# Patient Record
Sex: Female | Born: 1950 | Race: White | Hispanic: No | Marital: Married | Smoking: Never smoker
Health system: Southern US, Community
[De-identification: ages and names within clinical notes are randomized; demographics above are authoritative.]

## PROBLEM LIST (undated history)

## (undated) DIAGNOSIS — M1990 Unspecified osteoarthritis, unspecified site: Secondary | ICD-10-CM

## (undated) HISTORY — PX: ABDOMINAL HYSTERECTOMY: SHX81

## (undated) HISTORY — PX: AUGMENTATION MAMMAPLASTY: SUR837

## (undated) HISTORY — PX: JOINT REPLACEMENT: SHX530

## (undated) HISTORY — PX: EYE SURGERY: SHX253

---

## 1997-09-30 ENCOUNTER — Other Ambulatory Visit: Admission: RE | Admit: 1997-09-30 | Discharge: 1997-09-30 | Payer: Self-pay | Admitting: Obstetrics & Gynecology

## 1997-12-09 ENCOUNTER — Inpatient Hospital Stay (HOSPITAL_COMMUNITY): Admission: RE | Admit: 1997-12-09 | Discharge: 1997-12-12 | Payer: Self-pay | Admitting: Obstetrics & Gynecology

## 1999-01-26 ENCOUNTER — Other Ambulatory Visit: Admission: RE | Admit: 1999-01-26 | Discharge: 1999-01-26 | Payer: Self-pay | Admitting: Obstetrics & Gynecology

## 2000-02-16 ENCOUNTER — Other Ambulatory Visit: Admission: RE | Admit: 2000-02-16 | Discharge: 2000-02-16 | Payer: Self-pay | Admitting: Obstetrics & Gynecology

## 2001-04-23 ENCOUNTER — Other Ambulatory Visit: Admission: RE | Admit: 2001-04-23 | Discharge: 2001-04-23 | Payer: Self-pay | Admitting: Obstetrics & Gynecology

## 2002-06-03 ENCOUNTER — Other Ambulatory Visit: Admission: RE | Admit: 2002-06-03 | Discharge: 2002-06-03 | Payer: Self-pay | Admitting: Obstetrics & Gynecology

## 2003-06-10 ENCOUNTER — Other Ambulatory Visit: Admission: RE | Admit: 2003-06-10 | Discharge: 2003-06-10 | Payer: Self-pay | Admitting: Obstetrics & Gynecology

## 2003-08-20 ENCOUNTER — Observation Stay (HOSPITAL_COMMUNITY): Admission: RE | Admit: 2003-08-20 | Discharge: 2003-08-21 | Payer: Self-pay | Admitting: Obstetrics & Gynecology

## 2003-12-11 ENCOUNTER — Ambulatory Visit (HOSPITAL_BASED_OUTPATIENT_CLINIC_OR_DEPARTMENT_OTHER): Admission: RE | Admit: 2003-12-11 | Discharge: 2003-12-11 | Payer: Self-pay | Admitting: Plastic Surgery

## 2003-12-11 ENCOUNTER — Ambulatory Visit (HOSPITAL_COMMUNITY): Admission: RE | Admit: 2003-12-11 | Discharge: 2003-12-11 | Payer: Self-pay | Admitting: Plastic Surgery

## 2003-12-11 ENCOUNTER — Encounter (INDEPENDENT_AMBULATORY_CARE_PROVIDER_SITE_OTHER): Payer: Self-pay | Admitting: Specialist

## 2004-06-30 ENCOUNTER — Other Ambulatory Visit: Admission: RE | Admit: 2004-06-30 | Discharge: 2004-06-30 | Payer: Self-pay | Admitting: Obstetrics & Gynecology

## 2004-07-07 ENCOUNTER — Encounter: Admission: RE | Admit: 2004-07-07 | Discharge: 2004-07-07 | Payer: Self-pay | Admitting: Obstetrics & Gynecology

## 2005-07-08 ENCOUNTER — Encounter: Admission: RE | Admit: 2005-07-08 | Discharge: 2005-07-08 | Payer: Self-pay | Admitting: Obstetrics & Gynecology

## 2005-11-08 ENCOUNTER — Encounter: Admission: RE | Admit: 2005-11-08 | Discharge: 2005-11-08 | Payer: Self-pay | Admitting: Obstetrics & Gynecology

## 2006-05-10 ENCOUNTER — Encounter: Admission: RE | Admit: 2006-05-10 | Discharge: 2006-05-10 | Payer: Self-pay | Admitting: Obstetrics & Gynecology

## 2006-11-14 ENCOUNTER — Encounter: Admission: RE | Admit: 2006-11-14 | Discharge: 2006-11-14 | Payer: Self-pay | Admitting: Obstetrics & Gynecology

## 2007-05-16 ENCOUNTER — Encounter: Admission: RE | Admit: 2007-05-16 | Discharge: 2007-05-16 | Payer: Self-pay | Admitting: Obstetrics & Gynecology

## 2008-05-16 ENCOUNTER — Encounter: Admission: RE | Admit: 2008-05-16 | Discharge: 2008-05-16 | Payer: Self-pay | Admitting: Obstetrics & Gynecology

## 2009-05-18 ENCOUNTER — Encounter: Admission: RE | Admit: 2009-05-18 | Discharge: 2009-05-18 | Payer: Self-pay | Admitting: Obstetrics & Gynecology

## 2009-05-21 ENCOUNTER — Encounter: Admission: RE | Admit: 2009-05-21 | Discharge: 2009-05-21 | Payer: Self-pay | Admitting: Obstetrics & Gynecology

## 2010-04-20 ENCOUNTER — Other Ambulatory Visit: Payer: Self-pay | Admitting: Obstetrics & Gynecology

## 2010-04-20 DIAGNOSIS — Z1231 Encounter for screening mammogram for malignant neoplasm of breast: Secondary | ICD-10-CM

## 2010-05-20 ENCOUNTER — Ambulatory Visit
Admission: RE | Admit: 2010-05-20 | Discharge: 2010-05-20 | Disposition: A | Discharge status: Home / Self Care | Payer: 59 | Source: Ambulatory Visit | Attending: Obstetrics & Gynecology | Admitting: Obstetrics & Gynecology

## 2010-05-20 DIAGNOSIS — Z1231 Encounter for screening mammogram for malignant neoplasm of breast: Secondary | ICD-10-CM

## 2010-05-28 NOTE — Op Note (Signed)
NAMECHRISTON, [name]                 ACCOUNT NO.:  [id number]   MEDICAL RECORD NO.:  [id number]          PATIENT TYPE:  AMB   LOCATION:  DSC                          FACILITY:  [facility]   PHYSICIAN:  Alfredia Ferguson, M.D.  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   OPERATION:  Elliptical excision of venous hemangioma right posterior thigh.   SURGEON:  Alfredia Ferguson, M.D.   ANESTHESIA:  Xylocaine 2% with 1:100,000 epinephrine.   INDICATIONS FOR PROCEDURE:  This is a 60 year old woman with a slowly  enlarging vascular lesion of right posterior thigh.  It now interferes with  her ability to shave.  It has been recommended this area be removed to the  risk of cutting it and having it bleed.  She understands she will be trading  what she has for permanent potentially unsightly scar.  Despite that, she  wishes to proceed with the surgery.   DESCRIPTION OF PROCEDURE:  An elliptical skin mark was made around the  lesion and local anesthesia was infiltrated.  The area was prepped with  Betadine and draped with sterile drapes.  Elliptical excision of the lesion  down to the level of the subcutaneous tissue was carried out.  Specimen was  removed and passed off for pathology review.  Wound edges were closed by  approximating the dermis with interrupted 5-0 Monocryl suture.  Skin was  united with interrupted 5-0 nylon suture. The area was cleansed and dried  and light dressing was applied.      [name]  D:  12/11/2003  T:  12/11/2003  Job:  [id number]

## 2010-05-28 NOTE — Op Note (Signed)
NAME:  Diana [name], Diana [name]                           ACCOUNT NO.:  [id number]   MEDICAL RECORD NO.:  [id number]                   PATIENT TYPE:  AMB   LOCATION:  DAY                                  FACILITY:  [facility]   PHYSICIAN:  Ronald L. Ovidio Hanger, M.D.           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   OPERATIVE PROCEDURE:  1. Tension-free vaginal tape.  2. Cystoscopy.  3. Placement of suprapubic tube.   SURGEON:  Lucrezia Starch. Earlene Plater, M.D.   ASSISTANT:  Freddy Finner, M.D.   ESTIMATED BLOOD LOSS:  100 mL.   TUBES:  14 French Microvasive fader-tip suprapubic tube.   INDICATION FOR PROCEDURE:  Diana [name] is a lovely 60 year old white female who  presented with incontinence along with loss of vaginal support anterior and  posteriorly.  She has elected to undergo an anterior and posterior repair  and on urodynamics was found to have a low leak point pressure.  She was  felt to have both type 2 and type 3stress urinary incontinence.  After  understanding the risks, benefits, and alternatives, elected to proceed with  the above procedure.   PROCEDURE IN DETAIL:  After Dr. Jennette Kettle had plicated the anterior repair, I was  asked into the operating room.  A 16 French Foley catheter was placed and  the bladder was drained.  Lateral vaginal flaps were created at the  urethrovesical junction, but the endopelvic fascia was not perforated.  Punch holes were placed suprapubically one fingerbreadth superiorly and two  fingerbreadths laterally to the pubic symphysis, and utilizing the needle  for the TVTs, both were placed.  Cystourethroscopy was then performed with a  22.5 [place] Wolf panendoscope.  There were noted to be tangential needle  holes in the bladder laterally on both.  It was noted that she had a large-  volume bladder with dog-ears.  These  were removed and replaced.  The left  appeared to be totally normal with no perforations.  On the right it was not  perforated but it was just submucosal, so it was moved slightly more  lateral.  Reinspection revealed that there were no needles exposed in the  bladder at that point when the bladder was fully distended with a 70 degree  lens.  Some clots were irrigated from the bladder and the TVT tape was  placed with the needles and positioned.  Re-cystourethroscopy revealed that  there was no tape exposed and the tiny punch holes were not actively  bleeding.  The tape was then delivered in position.  Proper tension was  created.  The tape was completely deployed at the urethrovesical junction.  The bladder was then filled, and it was continent but it did not change the  angle of  the urethrovesical junction.  The tapes were cut at the skin level.  Good hemostasis was noted to be present.  The bladder was distended and a 14  [place] Microvasive fader-tip suprapubic tube was placed.  It was  locked in position utilizing the Cope mechanism and on inspection the  suprapubic tube appeared to be in good position and drained well.  The  bladder was then drained.  The case was turned back over to Dr. Jennette Kettle, who  closed the anterior vaginal defect and was proceeding with the posterior  repair.                                               Ronald L. Ovidio Hanger, M.D.    RLD/MEDQ  D:  08/20/2003  T:  08/20/2003  Job:  [id number]   cc:   Freddy Finner, M.D.  [address] [place]  [place] [address]  Fax: [phone]

## 2010-05-28 NOTE — H&P (Signed)
NAME:  Diana [name], Diana [name]                           ACCOUNT NO.:  [id number]   MEDICAL RECORD NO.:  [id number]                   PATIENT TYPE:  AMB   LOCATION:  DAY                                  FACILITY:  [facility]   PHYSICIAN:  Freddy Finner, M.D.                DATE OF BIRTH:  December 16, 1950   DATE OF PROCEDURE:  DATE OF DISCHARGE:                      STAT - MUST CHANGE TO CORRECT WORK TYPE   ADMISSION DIAGNOSIS:  Symptomatic cystocele, rectocele, stress urinary  incontinence.   HISTORY:  The patient is a 60 year old white, married female gravida 2, para  2, who had total abdominal hysterectomy, bilateral salpingo-oophorectomy in  1999 for very large fibroids and abnormal bleeding.  She also had loss of  urethral vesicle angle, had bilateral simple ovarian cysts and an  endometrial polyp.  At that time in addition to the abdominal hysterectomy  and bilateral salpingo-oophorectomy was accomplished, a Burch suspension of  the bladder.  The patient did well postoperatively, was maintained on a  Vivelle-Dot skin patch for her estrogen replacement therapy.  Approximately  2 years ago she had the onset of symptoms and is complaining of stress  urinary incontinence.  She does have a small cystocele and small rectocele.  She has requested surgical intervention.  She has been evaluated by Dr. Darvin Neighbours in the plan is to do a combined procedure with an anterior and  posterior vaginal repair, and Dr. Earlene Plater will do a sling suspension of the  urethra.   REVIEW OF SYSTEMS:  Her recurrent review of systems is otherwise negative.   PAST MEDICAL HISTORY:  The patient is noted to be ALLERGIC TO PENICILLIN.  She has no known significant medical illnesses.  She has intermittently had  some mild depression and has been treated with serotonin reuptake  inhibitors.  She is currently on no medications except Vivelle-Dot.   PREVIOUS SURGICAL PROCEDURES:  Include vaginal birth x2.  Hysterectomy noted  above.   She has had bunion surgery.  She had breast implants in 1984.  She  has had laser eye surgery.   FAMILY HISTORY:  Noncontributory.   Her most recent Pap smear was June of 2005 and was normal.  Most recent  mammogram was June of 2005 and it was normal.   PHYSICAL EXAMINATION:  HEENT:  Grossly within normal limits.  VITAL SIGNS:  Blood pressure in the office 118/76.  NECK:  Thyroid gland is not palpably enlarged.  CHEST:  Clear to auscultation.  HEART:  Normal sinus rhythm without murmurs, rubs, or gallops.  ABDOMEN:  Soft and nontender without appreciable organomegaly or palpable  masses.  BREAST EXAM:  Is considered to be normal.  PELVIC FINDINGS:  Are as noted in present illness.  EXTREMITIES:  Without cyanosis, clubbing or edema.   IMPRESSION:  Symptomatic cystocele, rectocele.   PLAN:  Surgery as noted above.  Freddy Finner, M.D.    WRN/MEDQ  D:  08/19/2003  T:  08/19/2003  Job:  [id number]

## 2010-05-28 NOTE — Op Note (Signed)
NAME:  DEONDREA, AGUADO                           ACCOUNT NO.:  [id number]   MEDICAL RECORD NO.:  [id number]                   PATIENT TYPE:  OBV   LOCATION:  0444                                 FACILITY:  [facility]   PHYSICIAN:  Freddy Finner, M.D.                DATE OF BIRTH:  [birth year]   DATE OF PROCEDURE:  08/21/2003  DATE OF DISCHARGE:                                 OPERATIVE REPORT   PREOPERATIVE DIAGNOSES:  1. Cystocele.  2. Rectocele.  3. Stress urinary incontinence.   POSTOPERATIVE DIAGNOSES:  1. Cystocele.  2. Rectocele.  3. Stress urinary incontinence.   OPERATIVE PROCEDURES:  1. Anterior and posterior vaginal repair.  2. Tension-free tape procedure per Windy Fast L. Earlene Plater, M.D.  That portion of     the procedure will be dictated by him.   ANESTHESIA:  General.   ESTIMATED INTRAOPERATIVE BLOOD LOSS:  300 mL.   INTRAOPERATIVE COMPLICATIONS:  None for my portion of the procedure.   CONDITION ON DISCHARGE TO THE RECOVERY ROOM:  Good.   Details of the present were recorded in the admission note.  The patient was  admitted on the morning of surgery.  She was placed in PAS hose.  She was  given a gram of Rocephin IV.  She was brought to the operating room and  there placed under adequate general anesthesia.  She was placed in the  dorsal lithotomy position using the [employer] system.  Betadine prep was  carried out in the usual fashion, including lower abdomen, perineum, vagina.  Sterile drapes were applied.  A posterior weighted vaginal retractor was  placed.  Anterior mucosa on the anterior vaginal wall was grasped with  Allises in the midline, and an incision was made with a scalpel.  Edges of  the mucosa were grasped with Allises.  The dissection was continued with  progressive sharp and blunt dissection and extension of the incision to a  level of approximately 1 cm proximal to the urethral meatus.  The  vesicovaginal fascia was carefully dissected and freed from  the mucosa,  freeing the bladder and the cystocele.  The cystocele was then reduced with  a total of three sutures using 0 Monocryl.  Dr. Earlene Plater then performed his  portion of the procedure.  The anterior incision was then closed in a  running locking 2-0 Monocryl suture.  Attention was turned posteriorly.  Edges of the mucosa at the level of the fourchette were grasped on either  side with Allises.  A pyramidal-shaped segment of tissue was excised from  the perineal body, which was skin.  The mucosa overlying the rectum was then  elevated with Allises and progressive blunt and sharp dissection was carried  out and incision made for a distance of approximately 6-8 cm along the  posterior wall.  The perirectal tissue was dissected free of the vaginal  mucosa.  Perirectal tissue  was then approximated from side to side using 0  Monocryl pop-off sutures.  The levators were approximated and elevated.  The  mucosa was then closed in a fashion similar to an episiotomy with running  locking suture along the posterior vaginal wall and subcuticular sutures  along the perineal body.  Hemostasis at this point  was felt to be adequate.  Two-inch iodoform gauze was placed in the vagina  as a pack.  The incisions made by Dr. Earlene Plater were closed with skin glue.  The  suprapubic catheter also placed by him was anchored to the skin with 2-0  nylon.  The patient was awakened and taken to the recovery room in good  condition.                                               Freddy Finner, M.D.    WRN/MEDQ  D:  08/20/2003  T:  08/21/2003  Job:  [id number]

## 2011-04-27 ENCOUNTER — Other Ambulatory Visit: Payer: Self-pay | Admitting: Obstetrics & Gynecology

## 2011-04-27 DIAGNOSIS — Z1231 Encounter for screening mammogram for malignant neoplasm of breast: Secondary | ICD-10-CM

## 2011-05-31 ENCOUNTER — Other Ambulatory Visit: Payer: Self-pay | Admitting: Dermatology

## 2011-05-31 ENCOUNTER — Ambulatory Visit
Admission: RE | Admit: 2011-05-31 | Discharge: 2011-05-31 | Disposition: A | Discharge status: Home / Self Care | Payer: 59 | Source: Ambulatory Visit | Attending: Obstetrics & Gynecology | Admitting: Obstetrics & Gynecology

## 2011-05-31 DIAGNOSIS — Z1231 Encounter for screening mammogram for malignant neoplasm of breast: Secondary | ICD-10-CM

## 2012-06-21 ENCOUNTER — Other Ambulatory Visit: Payer: Self-pay

## 2012-06-21 DIAGNOSIS — Z1231 Encounter for screening mammogram for malignant neoplasm of breast: Secondary | ICD-10-CM

## 2012-08-16 ENCOUNTER — Ambulatory Visit
Admission: RE | Admit: 2012-08-16 | Discharge: 2012-08-16 | Disposition: A | Discharge status: Home / Self Care | Payer: 59 | Source: Ambulatory Visit

## 2012-08-16 DIAGNOSIS — Z1231 Encounter for screening mammogram for malignant neoplasm of breast: Secondary | ICD-10-CM

## 2013-08-05 ENCOUNTER — Other Ambulatory Visit: Payer: Self-pay

## 2013-08-05 DIAGNOSIS — Z1231 Encounter for screening mammogram for malignant neoplasm of breast: Secondary | ICD-10-CM

## 2013-09-02 ENCOUNTER — Ambulatory Visit
Admission: RE | Admit: 2013-09-02 | Discharge: 2013-09-02 | Disposition: A | Discharge status: Home / Self Care | Payer: 59 | Source: Ambulatory Visit

## 2013-09-02 ENCOUNTER — Encounter (INDEPENDENT_AMBULATORY_CARE_PROVIDER_SITE_OTHER): Payer: Self-pay

## 2013-09-02 DIAGNOSIS — Z1231 Encounter for screening mammogram for malignant neoplasm of breast: Secondary | ICD-10-CM

## 2013-11-04 ENCOUNTER — Other Ambulatory Visit: Payer: Self-pay | Admitting: Obstetrics & Gynecology

## 2013-11-06 LAB — CYTOLOGY - PAP

## 2014-08-18 ENCOUNTER — Other Ambulatory Visit: Payer: Self-pay

## 2014-08-18 DIAGNOSIS — Z1231 Encounter for screening mammogram for malignant neoplasm of breast: Secondary | ICD-10-CM

## 2014-10-21 ENCOUNTER — Ambulatory Visit
Admission: RE | Admit: 2014-10-21 | Discharge: 2014-10-21 | Disposition: A | Discharge status: Home / Self Care | Payer: 59 | Source: Ambulatory Visit

## 2014-10-21 DIAGNOSIS — Z1231 Encounter for screening mammogram for malignant neoplasm of breast: Secondary | ICD-10-CM

## 2014-10-22 ENCOUNTER — Other Ambulatory Visit: Payer: Self-pay | Admitting: Obstetrics & Gynecology

## 2014-10-22 DIAGNOSIS — R928 Other abnormal and inconclusive findings on diagnostic imaging of breast: Secondary | ICD-10-CM

## 2014-11-18 ENCOUNTER — Ambulatory Visit
Admission: RE | Admit: 2014-11-18 | Discharge: 2014-11-18 | Disposition: A | Discharge status: Home / Self Care | Payer: 59 | Source: Ambulatory Visit | Attending: Obstetrics & Gynecology | Admitting: Obstetrics & Gynecology

## 2014-11-18 DIAGNOSIS — R928 Other abnormal and inconclusive findings on diagnostic imaging of breast: Secondary | ICD-10-CM

## 2015-09-25 ENCOUNTER — Other Ambulatory Visit: Payer: Self-pay | Admitting: Obstetrics & Gynecology

## 2015-09-25 DIAGNOSIS — Z9882 Breast implant status: Secondary | ICD-10-CM

## 2015-09-25 DIAGNOSIS — Z1231 Encounter for screening mammogram for malignant neoplasm of breast: Secondary | ICD-10-CM

## 2015-10-28 ENCOUNTER — Ambulatory Visit
Admission: RE | Admit: 2015-10-28 | Discharge: 2015-10-28 | Disposition: A | Discharge status: Home / Self Care | Payer: 59 | Source: Ambulatory Visit | Attending: Obstetrics & Gynecology | Admitting: Obstetrics & Gynecology

## 2015-10-28 DIAGNOSIS — Z9882 Breast implant status: Secondary | ICD-10-CM

## 2015-10-28 DIAGNOSIS — Z1231 Encounter for screening mammogram for malignant neoplasm of breast: Secondary | ICD-10-CM

## 2016-10-17 ENCOUNTER — Other Ambulatory Visit: Payer: Self-pay | Admitting: Obstetrics & Gynecology

## 2016-10-17 DIAGNOSIS — Z1231 Encounter for screening mammogram for malignant neoplasm of breast: Secondary | ICD-10-CM

## 2016-11-28 ENCOUNTER — Ambulatory Visit
Admission: RE | Admit: 2016-11-28 | Discharge: 2016-11-28 | Disposition: A | Discharge status: Home / Self Care | Payer: Medicare HMO | Source: Ambulatory Visit | Attending: Obstetrics & Gynecology | Admitting: Obstetrics & Gynecology

## 2016-11-28 DIAGNOSIS — Z1231 Encounter for screening mammogram for malignant neoplasm of breast: Secondary | ICD-10-CM

## 2017-01-16 DIAGNOSIS — Z90710 Acquired absence of both cervix and uterus: Secondary | ICD-10-CM | POA: Diagnosis not present

## 2017-01-16 DIAGNOSIS — Z01419 Encounter for gynecological examination (general) (routine) without abnormal findings: Secondary | ICD-10-CM | POA: Diagnosis not present

## 2017-01-16 DIAGNOSIS — Z1272 Encounter for screening for malignant neoplasm of vagina: Secondary | ICD-10-CM | POA: Diagnosis not present

## 2017-01-16 DIAGNOSIS — Z6825 Body mass index (BMI) 25.0-25.9, adult: Secondary | ICD-10-CM | POA: Diagnosis not present

## 2017-03-06 DIAGNOSIS — R82998 Other abnormal findings in urine: Secondary | ICD-10-CM | POA: Diagnosis not present

## 2017-03-06 DIAGNOSIS — Z79899 Other long term (current) drug therapy: Secondary | ICD-10-CM | POA: Diagnosis not present

## 2017-03-06 DIAGNOSIS — M859 Disorder of bone density and structure, unspecified: Secondary | ICD-10-CM | POA: Diagnosis not present

## 2017-03-09 DIAGNOSIS — Z1212 Encounter for screening for malignant neoplasm of rectum: Secondary | ICD-10-CM | POA: Diagnosis not present

## 2017-03-13 DIAGNOSIS — Z23 Encounter for immunization: Secondary | ICD-10-CM | POA: Diagnosis not present

## 2017-03-13 DIAGNOSIS — Z1389 Encounter for screening for other disorder: Secondary | ICD-10-CM | POA: Diagnosis not present

## 2017-03-13 DIAGNOSIS — Z6824 Body mass index (BMI) 24.0-24.9, adult: Secondary | ICD-10-CM | POA: Diagnosis not present

## 2017-03-13 DIAGNOSIS — M859 Disorder of bone density and structure, unspecified: Secondary | ICD-10-CM | POA: Diagnosis not present

## 2017-03-13 DIAGNOSIS — Z0000 Encounter for general adult medical examination without abnormal findings: Secondary | ICD-10-CM | POA: Diagnosis not present

## 2017-03-25 DIAGNOSIS — J22 Unspecified acute lower respiratory infection: Secondary | ICD-10-CM | POA: Diagnosis not present

## 2017-04-25 DIAGNOSIS — D1801 Hemangioma of skin and subcutaneous tissue: Secondary | ICD-10-CM | POA: Diagnosis not present

## 2017-04-25 DIAGNOSIS — D2261 Melanocytic nevi of right upper limb, including shoulder: Secondary | ICD-10-CM | POA: Diagnosis not present

## 2017-04-25 DIAGNOSIS — D2262 Melanocytic nevi of left upper limb, including shoulder: Secondary | ICD-10-CM | POA: Diagnosis not present

## 2017-04-25 DIAGNOSIS — L812 Freckles: Secondary | ICD-10-CM | POA: Diagnosis not present

## 2017-04-25 DIAGNOSIS — Z85828 Personal history of other malignant neoplasm of skin: Secondary | ICD-10-CM | POA: Diagnosis not present

## 2017-04-25 DIAGNOSIS — D2271 Melanocytic nevi of right lower limb, including hip: Secondary | ICD-10-CM | POA: Diagnosis not present

## 2017-04-25 DIAGNOSIS — D225 Melanocytic nevi of trunk: Secondary | ICD-10-CM | POA: Diagnosis not present

## 2017-04-25 DIAGNOSIS — L920 Granuloma annulare: Secondary | ICD-10-CM | POA: Diagnosis not present

## 2017-04-25 DIAGNOSIS — L821 Other seborrheic keratosis: Secondary | ICD-10-CM | POA: Diagnosis not present

## 2017-04-25 DIAGNOSIS — D2272 Melanocytic nevi of left lower limb, including hip: Secondary | ICD-10-CM | POA: Diagnosis not present

## 2017-06-19 DIAGNOSIS — H5203 Hypermetropia, bilateral: Secondary | ICD-10-CM | POA: Diagnosis not present

## 2017-06-19 DIAGNOSIS — H2513 Age-related nuclear cataract, bilateral: Secondary | ICD-10-CM | POA: Diagnosis not present

## 2017-06-19 DIAGNOSIS — H04123 Dry eye syndrome of bilateral lacrimal glands: Secondary | ICD-10-CM | POA: Diagnosis not present

## 2017-10-27 ENCOUNTER — Other Ambulatory Visit: Payer: Self-pay | Admitting: Obstetrics & Gynecology

## 2017-10-27 DIAGNOSIS — Z1231 Encounter for screening mammogram for malignant neoplasm of breast: Secondary | ICD-10-CM

## 2017-12-05 ENCOUNTER — Ambulatory Visit
Admission: RE | Admit: 2017-12-05 | Discharge: 2017-12-05 | Disposition: A | Discharge status: Home / Self Care | Payer: Medicare HMO | Source: Ambulatory Visit | Attending: Obstetrics & Gynecology | Admitting: Obstetrics & Gynecology

## 2017-12-05 DIAGNOSIS — Z1231 Encounter for screening mammogram for malignant neoplasm of breast: Secondary | ICD-10-CM

## 2018-02-22 ENCOUNTER — Other Ambulatory Visit: Payer: Self-pay | Admitting: Obstetrics & Gynecology

## 2018-02-22 DIAGNOSIS — N6310 Unspecified lump in the right breast, unspecified quadrant: Secondary | ICD-10-CM

## 2018-02-22 DIAGNOSIS — Z6825 Body mass index (BMI) 25.0-25.9, adult: Secondary | ICD-10-CM | POA: Diagnosis not present

## 2018-02-22 DIAGNOSIS — N958 Other specified menopausal and perimenopausal disorders: Secondary | ICD-10-CM | POA: Diagnosis not present

## 2018-02-22 DIAGNOSIS — M8588 Other specified disorders of bone density and structure, other site: Secondary | ICD-10-CM | POA: Diagnosis not present

## 2018-02-22 DIAGNOSIS — Z01419 Encounter for gynecological examination (general) (routine) without abnormal findings: Secondary | ICD-10-CM | POA: Diagnosis not present

## 2018-03-01 ENCOUNTER — Ambulatory Visit
Admission: RE | Admit: 2018-03-01 | Discharge: 2018-03-01 | Disposition: A | Discharge status: Home / Self Care | Payer: Medicare HMO | Source: Ambulatory Visit | Attending: Obstetrics & Gynecology | Admitting: Obstetrics & Gynecology

## 2018-03-01 DIAGNOSIS — N6310 Unspecified lump in the right breast, unspecified quadrant: Secondary | ICD-10-CM

## 2018-03-01 DIAGNOSIS — N6311 Unspecified lump in the right breast, upper outer quadrant: Secondary | ICD-10-CM | POA: Diagnosis not present

## 2018-03-01 DIAGNOSIS — R922 Inconclusive mammogram: Secondary | ICD-10-CM | POA: Diagnosis not present

## 2018-03-08 DIAGNOSIS — M859 Disorder of bone density and structure, unspecified: Secondary | ICD-10-CM | POA: Diagnosis not present

## 2018-03-08 DIAGNOSIS — Z79899 Other long term (current) drug therapy: Secondary | ICD-10-CM | POA: Diagnosis not present

## 2018-03-08 DIAGNOSIS — R82998 Other abnormal findings in urine: Secondary | ICD-10-CM | POA: Diagnosis not present

## 2018-03-16 DIAGNOSIS — E7849 Other hyperlipidemia: Secondary | ICD-10-CM | POA: Diagnosis not present

## 2018-03-16 DIAGNOSIS — Z6825 Body mass index (BMI) 25.0-25.9, adult: Secondary | ICD-10-CM | POA: Diagnosis not present

## 2018-03-16 DIAGNOSIS — Z0000 Encounter for general adult medical examination without abnormal findings: Secondary | ICD-10-CM | POA: Diagnosis not present

## 2018-03-16 DIAGNOSIS — Z1389 Encounter for screening for other disorder: Secondary | ICD-10-CM | POA: Diagnosis not present

## 2018-03-16 DIAGNOSIS — M859 Disorder of bone density and structure, unspecified: Secondary | ICD-10-CM | POA: Diagnosis not present

## 2018-03-16 DIAGNOSIS — Z859 Personal history of malignant neoplasm, unspecified: Secondary | ICD-10-CM | POA: Diagnosis not present

## 2018-03-16 DIAGNOSIS — Z1331 Encounter for screening for depression: Secondary | ICD-10-CM | POA: Diagnosis not present

## 2018-10-18 DIAGNOSIS — Z23 Encounter for immunization: Secondary | ICD-10-CM | POA: Diagnosis not present

## 2019-01-29 ENCOUNTER — Ambulatory Visit: Payer: Medicare Other | Attending: Internal Medicine

## 2019-01-29 DIAGNOSIS — Z23 Encounter for immunization: Secondary | ICD-10-CM | POA: Insufficient documentation

## 2019-01-29 NOTE — Progress Notes (Signed)
   Covid-19 Vaccination Clinic  Name:  Diana [name]    MRN: [id number] DOB: 20-Sep-1950  01/29/2019  Diana [name] was observed post Covid-19 immunization for 15 minutes without incidence. She was provided with Vaccine Information Sheet and instruction to access the V-Safe system.   Diana [name] was instructed to call 911 with any severe reactions post vaccine: Marland Kitchen Difficulty breathing  . Swelling of your face and throat  . A fast heartbeat  . A bad rash all over your body  . Dizziness and weakness    Immunizations Administered    Name Date Dose VIS Date Route   Pfizer COVID-19 Vaccine 01/29/2019  3:48 PM 0.3 mL 12/21/2018 Intramuscular   Manufacturer: [place]   Lot: [id number]   [place]: [id number]

## 2019-02-20 ENCOUNTER — Ambulatory Visit: Payer: Medicare HMO | Attending: Internal Medicine

## 2019-02-20 DIAGNOSIS — Z23 Encounter for immunization: Secondary | ICD-10-CM

## 2019-02-20 NOTE — Progress Notes (Signed)
   Covid-19 Vaccination Clinic  Name:  Diana [name]    MRN: [id number] DOB: 07-05-1950  02/20/2019  Diana [name] was observed post Covid-19 immunization for 15 minutes without incidence. She was provided with Vaccine Information Sheet and instruction to access the V-Safe system.   Diana [name] was instructed to call 911 with any severe reactions post vaccine: Marland Kitchen Difficulty breathing  . Swelling of your face and throat  . A fast heartbeat  . A bad rash all over your body  . Dizziness and weakness    Immunizations Administered    Name Date Dose VIS Date Route   Pfizer COVID-19 Vaccine 02/20/2019  8:22 AM 0.3 mL 12/21/2018 Intramuscular   Manufacturer: [place]   Lot: [id number]   [place]: [id number]

## 2019-02-26 ENCOUNTER — Other Ambulatory Visit: Payer: Self-pay | Admitting: Obstetrics & Gynecology

## 2019-02-26 DIAGNOSIS — Z1231 Encounter for screening mammogram for malignant neoplasm of breast: Secondary | ICD-10-CM

## 2019-03-12 DIAGNOSIS — Z124 Encounter for screening for malignant neoplasm of cervix: Secondary | ICD-10-CM | POA: Diagnosis not present

## 2019-03-12 DIAGNOSIS — Z6824 Body mass index (BMI) 24.0-24.9, adult: Secondary | ICD-10-CM | POA: Diagnosis not present

## 2019-03-13 DIAGNOSIS — E7849 Other hyperlipidemia: Secondary | ICD-10-CM | POA: Diagnosis not present

## 2019-03-13 DIAGNOSIS — M859 Disorder of bone density and structure, unspecified: Secondary | ICD-10-CM | POA: Diagnosis not present

## 2019-03-13 DIAGNOSIS — Z0000 Encounter for general adult medical examination without abnormal findings: Secondary | ICD-10-CM | POA: Diagnosis not present

## 2019-03-20 DIAGNOSIS — E785 Hyperlipidemia, unspecified: Secondary | ICD-10-CM | POA: Diagnosis not present

## 2019-03-20 DIAGNOSIS — Z0000 Encounter for general adult medical examination without abnormal findings: Secondary | ICD-10-CM | POA: Diagnosis not present

## 2019-03-20 DIAGNOSIS — M8580 Other specified disorders of bone density and structure, unspecified site: Secondary | ICD-10-CM | POA: Diagnosis not present

## 2019-04-04 ENCOUNTER — Ambulatory Visit
Admission: RE | Admit: 2019-04-04 | Discharge: 2019-04-04 | Disposition: A | Discharge status: Home / Self Care | Payer: Medicare HMO | Source: Ambulatory Visit | Attending: Obstetrics & Gynecology | Admitting: Obstetrics & Gynecology

## 2019-04-04 ENCOUNTER — Other Ambulatory Visit: Payer: Self-pay

## 2019-04-04 DIAGNOSIS — Z1231 Encounter for screening mammogram for malignant neoplasm of breast: Secondary | ICD-10-CM | POA: Diagnosis not present

## 2019-05-13 DIAGNOSIS — L821 Other seborrheic keratosis: Secondary | ICD-10-CM | POA: Diagnosis not present

## 2019-05-13 DIAGNOSIS — D2222 Melanocytic nevi of left ear and external auricular canal: Secondary | ICD-10-CM | POA: Diagnosis not present

## 2019-05-13 DIAGNOSIS — Z85828 Personal history of other malignant neoplasm of skin: Secondary | ICD-10-CM | POA: Diagnosis not present

## 2019-05-13 DIAGNOSIS — D1801 Hemangioma of skin and subcutaneous tissue: Secondary | ICD-10-CM | POA: Diagnosis not present

## 2019-05-13 DIAGNOSIS — D2261 Melanocytic nevi of right upper limb, including shoulder: Secondary | ICD-10-CM | POA: Diagnosis not present

## 2019-05-13 DIAGNOSIS — C44519 Basal cell carcinoma of skin of other part of trunk: Secondary | ICD-10-CM | POA: Diagnosis not present

## 2019-05-13 DIAGNOSIS — D485 Neoplasm of uncertain behavior of skin: Secondary | ICD-10-CM | POA: Diagnosis not present

## 2019-07-30 DIAGNOSIS — H2513 Age-related nuclear cataract, bilateral: Secondary | ICD-10-CM | POA: Diagnosis not present

## 2019-07-30 DIAGNOSIS — H524 Presbyopia: Secondary | ICD-10-CM | POA: Diagnosis not present

## 2019-10-26 DIAGNOSIS — Z23 Encounter for immunization: Secondary | ICD-10-CM | POA: Diagnosis not present

## 2019-12-19 ENCOUNTER — Other Ambulatory Visit: Payer: Self-pay

## 2019-12-19 ENCOUNTER — Ambulatory Visit (INDEPENDENT_AMBULATORY_CARE_PROVIDER_SITE_OTHER): Payer: Medicare HMO

## 2019-12-19 ENCOUNTER — Ambulatory Visit: Payer: Medicare HMO | Admitting: Podiatry

## 2019-12-19 ENCOUNTER — Other Ambulatory Visit: Payer: Self-pay | Admitting: Podiatry

## 2019-12-19 DIAGNOSIS — M7672 Peroneal tendinitis, left leg: Secondary | ICD-10-CM

## 2019-12-19 DIAGNOSIS — M7671 Peroneal tendinitis, right leg: Secondary | ICD-10-CM

## 2019-12-19 DIAGNOSIS — M79671 Pain in right foot: Secondary | ICD-10-CM

## 2019-12-19 DIAGNOSIS — M79672 Pain in left foot: Secondary | ICD-10-CM | POA: Diagnosis not present

## 2019-12-19 MED ORDER — TRIAMCINOLONE ACETONIDE 10 MG/ML IJ SUSP
10.0000 mg | Freq: Once | INTRAMUSCULAR | Status: AC
  Administered 2019-12-19: 10 mg

## 2019-12-19 NOTE — Progress Notes (Signed)
Subjective:   Patient ID: Diana [name], female   DOB: 69 y.o.   MRN: [id number]   HPI Patient presents stating she has been getting a lot of pain in her left ankle and into the outside of the right foot and ankle.  States she does not remember specific injury but states it is been sore and swelling and it is been hard for her to be active but she did walk on the beach and it seemed to occur after an especially long walk on the beach.  Patient does not smoke likes to be active   Review of Systems  All other systems reviewed and are negative.       Objective:  Physical Exam Vitals and nursing note reviewed.  Constitutional:      Appearance: She is well-developed and well-nourished.  Cardiovascular:     Pulses: Intact distal pulses.  Pulmonary:     Effort: Pulmonary effort is normal.  Musculoskeletal:        General: Normal range of motion.  Skin:    General: Skin is warm.  Neurological:     Mental Status: She is alert.     Neurovascular status intact muscle strength adequate range of motion within normal limits.  Patient is found to have inflammation pain within the peroneal complex right with moderate swelling associated with it and it is sore with palpation.  No signs of muscle strength loss or muscle compromise from this condition.  Patient has good digital perfusion well oriented x3     Assessment:  Inflammatory peroneal tendinitis right secondary to probable gait change from exercise     Plan:  H&P x-ray done and I went ahead did sterile prep and injected the peroneal complex near insertion 3 mg Kenalog 5 g Xylocaine applied ankle compression stocking advised on ice therapy topical antiinflammatory treatment and patient will be seen back as symptoms indicate  X-rays were negative for signs of fracture did indicate moderate bunion no other pathology noted

## 2020-03-18 ENCOUNTER — Other Ambulatory Visit: Payer: Self-pay | Admitting: Obstetrics & Gynecology

## 2020-03-18 DIAGNOSIS — Z1231 Encounter for screening mammogram for malignant neoplasm of breast: Secondary | ICD-10-CM

## 2020-03-19 DIAGNOSIS — Z01419 Encounter for gynecological examination (general) (routine) without abnormal findings: Secondary | ICD-10-CM | POA: Diagnosis not present

## 2020-03-19 DIAGNOSIS — Z6825 Body mass index (BMI) 25.0-25.9, adult: Secondary | ICD-10-CM | POA: Diagnosis not present

## 2020-03-20 DIAGNOSIS — M859 Disorder of bone density and structure, unspecified: Secondary | ICD-10-CM | POA: Diagnosis not present

## 2020-03-20 DIAGNOSIS — E785 Hyperlipidemia, unspecified: Secondary | ICD-10-CM | POA: Diagnosis not present

## 2020-03-27 DIAGNOSIS — Z0000 Encounter for general adult medical examination without abnormal findings: Secondary | ICD-10-CM | POA: Diagnosis not present

## 2020-03-27 DIAGNOSIS — R82998 Other abnormal findings in urine: Secondary | ICD-10-CM | POA: Diagnosis not present

## 2020-03-27 DIAGNOSIS — E785 Hyperlipidemia, unspecified: Secondary | ICD-10-CM | POA: Diagnosis not present

## 2020-03-27 DIAGNOSIS — K921 Melena: Secondary | ICD-10-CM | POA: Diagnosis not present

## 2020-03-27 DIAGNOSIS — Z1339 Encounter for screening examination for other mental health and behavioral disorders: Secondary | ICD-10-CM | POA: Diagnosis not present

## 2020-03-27 DIAGNOSIS — Z1331 Encounter for screening for depression: Secondary | ICD-10-CM | POA: Diagnosis not present

## 2020-05-12 ENCOUNTER — Ambulatory Visit
Admission: RE | Admit: 2020-05-12 | Discharge: 2020-05-12 | Disposition: A | Discharge status: Home / Self Care | Payer: Medicare HMO | Source: Ambulatory Visit | Attending: Obstetrics & Gynecology | Admitting: Obstetrics & Gynecology

## 2020-05-12 ENCOUNTER — Other Ambulatory Visit: Payer: Self-pay

## 2020-05-12 DIAGNOSIS — Z1231 Encounter for screening mammogram for malignant neoplasm of breast: Secondary | ICD-10-CM

## 2020-05-12 DIAGNOSIS — Z85828 Personal history of other malignant neoplasm of skin: Secondary | ICD-10-CM | POA: Diagnosis not present

## 2020-05-12 DIAGNOSIS — D225 Melanocytic nevi of trunk: Secondary | ICD-10-CM | POA: Diagnosis not present

## 2020-05-12 DIAGNOSIS — D2262 Melanocytic nevi of left upper limb, including shoulder: Secondary | ICD-10-CM | POA: Diagnosis not present

## 2020-05-12 DIAGNOSIS — D2261 Melanocytic nevi of right upper limb, including shoulder: Secondary | ICD-10-CM | POA: Diagnosis not present

## 2020-05-12 DIAGNOSIS — D1801 Hemangioma of skin and subcutaneous tissue: Secondary | ICD-10-CM | POA: Diagnosis not present

## 2020-05-12 DIAGNOSIS — L821 Other seborrheic keratosis: Secondary | ICD-10-CM | POA: Diagnosis not present

## 2020-05-12 DIAGNOSIS — L812 Freckles: Secondary | ICD-10-CM | POA: Diagnosis not present

## 2020-06-23 DIAGNOSIS — R269 Unspecified abnormalities of gait and mobility: Secondary | ICD-10-CM | POA: Diagnosis not present

## 2020-06-23 DIAGNOSIS — M79671 Pain in right foot: Secondary | ICD-10-CM | POA: Diagnosis not present

## 2020-06-23 DIAGNOSIS — M79604 Pain in right leg: Secondary | ICD-10-CM | POA: Diagnosis not present

## 2020-07-16 DIAGNOSIS — M79671 Pain in right foot: Secondary | ICD-10-CM | POA: Diagnosis not present

## 2020-07-16 DIAGNOSIS — M79604 Pain in right leg: Secondary | ICD-10-CM | POA: Diagnosis not present

## 2020-07-16 DIAGNOSIS — R269 Unspecified abnormalities of gait and mobility: Secondary | ICD-10-CM | POA: Diagnosis not present

## 2020-07-22 DIAGNOSIS — M79604 Pain in right leg: Secondary | ICD-10-CM | POA: Diagnosis not present

## 2020-07-22 DIAGNOSIS — M79671 Pain in right foot: Secondary | ICD-10-CM | POA: Diagnosis not present

## 2020-07-22 DIAGNOSIS — R269 Unspecified abnormalities of gait and mobility: Secondary | ICD-10-CM | POA: Diagnosis not present

## 2020-08-06 DIAGNOSIS — M79671 Pain in right foot: Secondary | ICD-10-CM | POA: Diagnosis not present

## 2020-08-06 DIAGNOSIS — M79604 Pain in right leg: Secondary | ICD-10-CM | POA: Diagnosis not present

## 2020-08-06 DIAGNOSIS — R269 Unspecified abnormalities of gait and mobility: Secondary | ICD-10-CM | POA: Diagnosis not present

## 2020-08-07 DIAGNOSIS — H524 Presbyopia: Secondary | ICD-10-CM | POA: Diagnosis not present

## 2020-08-07 DIAGNOSIS — H2513 Age-related nuclear cataract, bilateral: Secondary | ICD-10-CM | POA: Diagnosis not present

## 2020-08-25 DIAGNOSIS — R269 Unspecified abnormalities of gait and mobility: Secondary | ICD-10-CM | POA: Diagnosis not present

## 2020-08-25 DIAGNOSIS — M79671 Pain in right foot: Secondary | ICD-10-CM | POA: Diagnosis not present

## 2020-08-25 DIAGNOSIS — M79604 Pain in right leg: Secondary | ICD-10-CM | POA: Diagnosis not present

## 2020-10-28 DIAGNOSIS — M1611 Unilateral primary osteoarthritis, right hip: Secondary | ICD-10-CM | POA: Diagnosis not present

## 2020-10-28 DIAGNOSIS — M25551 Pain in right hip: Secondary | ICD-10-CM | POA: Diagnosis not present

## 2020-11-03 DIAGNOSIS — K6389 Other specified diseases of intestine: Secondary | ICD-10-CM | POA: Diagnosis not present

## 2020-11-03 DIAGNOSIS — K635 Polyp of colon: Secondary | ICD-10-CM | POA: Diagnosis not present

## 2020-11-03 DIAGNOSIS — Z1211 Encounter for screening for malignant neoplasm of colon: Secondary | ICD-10-CM | POA: Diagnosis not present

## 2020-11-03 DIAGNOSIS — D122 Benign neoplasm of ascending colon: Secondary | ICD-10-CM | POA: Diagnosis not present

## 2020-11-07 DIAGNOSIS — Z23 Encounter for immunization: Secondary | ICD-10-CM | POA: Diagnosis not present

## 2020-11-20 DIAGNOSIS — Z79899 Other long term (current) drug therapy: Secondary | ICD-10-CM | POA: Diagnosis not present

## 2020-11-20 DIAGNOSIS — Z5181 Encounter for therapeutic drug level monitoring: Secondary | ICD-10-CM | POA: Diagnosis not present

## 2020-11-20 DIAGNOSIS — M1611 Unilateral primary osteoarthritis, right hip: Secondary | ICD-10-CM | POA: Diagnosis not present

## 2020-11-23 DIAGNOSIS — E785 Hyperlipidemia, unspecified: Secondary | ICD-10-CM | POA: Diagnosis not present

## 2020-11-23 DIAGNOSIS — Z01818 Encounter for other preprocedural examination: Secondary | ICD-10-CM | POA: Diagnosis not present

## 2020-11-23 DIAGNOSIS — R030 Elevated blood-pressure reading, without diagnosis of hypertension: Secondary | ICD-10-CM | POA: Diagnosis not present

## 2020-11-23 DIAGNOSIS — M1611 Unilateral primary osteoarthritis, right hip: Secondary | ICD-10-CM | POA: Diagnosis not present

## 2020-12-14 DIAGNOSIS — M1611 Unilateral primary osteoarthritis, right hip: Secondary | ICD-10-CM | POA: Diagnosis not present

## 2020-12-14 DIAGNOSIS — Z96641 Presence of right artificial hip joint: Secondary | ICD-10-CM | POA: Diagnosis not present

## 2021-02-03 DIAGNOSIS — M1612 Unilateral primary osteoarthritis, left hip: Secondary | ICD-10-CM | POA: Diagnosis not present

## 2021-02-03 DIAGNOSIS — Z4789 Encounter for other orthopedic aftercare: Secondary | ICD-10-CM | POA: Diagnosis not present

## 2021-04-05 DIAGNOSIS — M859 Disorder of bone density and structure, unspecified: Secondary | ICD-10-CM | POA: Diagnosis not present

## 2021-04-05 DIAGNOSIS — E785 Hyperlipidemia, unspecified: Secondary | ICD-10-CM | POA: Diagnosis not present

## 2021-04-05 DIAGNOSIS — R7989 Other specified abnormal findings of blood chemistry: Secondary | ICD-10-CM | POA: Diagnosis not present

## 2021-04-14 DIAGNOSIS — Z1339 Encounter for screening examination for other mental health and behavioral disorders: Secondary | ICD-10-CM | POA: Diagnosis not present

## 2021-04-14 DIAGNOSIS — E785 Hyperlipidemia, unspecified: Secondary | ICD-10-CM | POA: Diagnosis not present

## 2021-04-14 DIAGNOSIS — Z0000 Encounter for general adult medical examination without abnormal findings: Secondary | ICD-10-CM | POA: Diagnosis not present

## 2021-04-14 DIAGNOSIS — M1990 Unspecified osteoarthritis, unspecified site: Secondary | ICD-10-CM | POA: Diagnosis not present

## 2021-04-14 DIAGNOSIS — Z1331 Encounter for screening for depression: Secondary | ICD-10-CM | POA: Diagnosis not present

## 2021-04-20 DIAGNOSIS — N959 Unspecified menopausal and perimenopausal disorder: Secondary | ICD-10-CM | POA: Diagnosis not present

## 2021-04-20 DIAGNOSIS — Z01419 Encounter for gynecological examination (general) (routine) without abnormal findings: Secondary | ICD-10-CM | POA: Diagnosis not present

## 2021-04-20 DIAGNOSIS — N6310 Unspecified lump in the right breast, unspecified quadrant: Secondary | ICD-10-CM | POA: Diagnosis not present

## 2021-04-20 DIAGNOSIS — Z6824 Body mass index (BMI) 24.0-24.9, adult: Secondary | ICD-10-CM | POA: Diagnosis not present

## 2021-04-22 ENCOUNTER — Other Ambulatory Visit: Payer: Self-pay | Admitting: Obstetrics and Gynecology

## 2021-04-22 DIAGNOSIS — N6310 Unspecified lump in the right breast, unspecified quadrant: Secondary | ICD-10-CM

## 2021-04-26 DIAGNOSIS — M1612 Unilateral primary osteoarthritis, left hip: Secondary | ICD-10-CM | POA: Diagnosis not present

## 2021-05-12 DIAGNOSIS — Z471 Aftercare following joint replacement surgery: Secondary | ICD-10-CM | POA: Diagnosis not present

## 2021-05-12 DIAGNOSIS — Z4789 Encounter for other orthopedic aftercare: Secondary | ICD-10-CM | POA: Diagnosis not present

## 2021-05-13 ENCOUNTER — Other Ambulatory Visit: Payer: Self-pay | Admitting: Obstetrics and Gynecology

## 2021-05-13 ENCOUNTER — Ambulatory Visit
Admission: RE | Admit: 2021-05-13 | Discharge: 2021-05-13 | Disposition: A | Discharge status: Home / Self Care | Payer: Medicare HMO | Source: Ambulatory Visit | Attending: Obstetrics and Gynecology | Admitting: Obstetrics and Gynecology

## 2021-05-13 DIAGNOSIS — N6310 Unspecified lump in the right breast, unspecified quadrant: Secondary | ICD-10-CM

## 2021-05-13 DIAGNOSIS — N6041 Mammary duct ectasia of right breast: Secondary | ICD-10-CM | POA: Diagnosis not present

## 2021-05-13 DIAGNOSIS — R922 Inconclusive mammogram: Secondary | ICD-10-CM | POA: Diagnosis not present

## 2021-05-19 ENCOUNTER — Ambulatory Visit
Admission: RE | Admit: 2021-05-19 | Discharge: 2021-05-19 | Disposition: A | Discharge status: Home / Self Care | Payer: Medicare HMO | Source: Ambulatory Visit | Attending: Obstetrics and Gynecology | Admitting: Obstetrics and Gynecology

## 2021-05-19 DIAGNOSIS — N6011 Diffuse cystic mastopathy of right breast: Secondary | ICD-10-CM | POA: Diagnosis not present

## 2021-05-19 DIAGNOSIS — N6310 Unspecified lump in the right breast, unspecified quadrant: Secondary | ICD-10-CM

## 2021-05-19 DIAGNOSIS — N6312 Unspecified lump in the right breast, upper inner quadrant: Secondary | ICD-10-CM | POA: Diagnosis not present

## 2021-06-11 ENCOUNTER — Ambulatory Visit: Payer: Self-pay | Admitting: Surgery

## 2021-06-11 DIAGNOSIS — N6311 Unspecified lump in the right breast, upper outer quadrant: Secondary | ICD-10-CM | POA: Diagnosis not present

## 2021-06-11 DIAGNOSIS — T8544XA Capsular contracture of breast implant, initial encounter: Secondary | ICD-10-CM | POA: Diagnosis not present

## 2021-06-14 DIAGNOSIS — N6310 Unspecified lump in the right breast, unspecified quadrant: Secondary | ICD-10-CM | POA: Diagnosis not present

## 2021-06-14 DIAGNOSIS — Z9882 Breast implant status: Secondary | ICD-10-CM | POA: Diagnosis not present

## 2021-06-16 DIAGNOSIS — Z4789 Encounter for other orthopedic aftercare: Secondary | ICD-10-CM | POA: Diagnosis not present

## 2021-06-21 ENCOUNTER — Other Ambulatory Visit: Payer: Self-pay | Admitting: Surgery

## 2021-06-21 DIAGNOSIS — N6310 Unspecified lump in the right breast, unspecified quadrant: Secondary | ICD-10-CM

## 2021-07-27 NOTE — Pre-Procedure Instructions (Signed)
Surgical Instructions    Your procedure is scheduled on Tuesday, July 25th.  Report to [facility] Main Entrance "A" at 12:00 P.M., then check in with the Admitting office.  Call this number if you have problems the morning of surgery:  [phone]   If you have any questions prior to your surgery date call [phone]: Open Monday-Friday 8am-4pm    Remember:  Do not eat after midnight the night before your surgery  You may drink clear liquids until 11:00 a.m. the morning of your surgery.   Clear liquids allowed are: Water, Non-Citrus Juices (without pulp), Carbonated Beverages, Clear Tea, Black Coffee ONLY (NO MILK, CREAM OR POWDERED CREAMER of any kind), and Gatorade    Take these medicines the morning of surgery with A SIP OF WATER: NONE   As of today, STOP taking any Aspirin (unless otherwise instructed by your surgeon) Aleve, Naproxen, Ibuprofen, Motrin, Advil, Goody's, BC's, all herbal medications, fish oil, and all vitamins.           Do not wear jewelry or makeup Do not wear lotions, powders, perfumes, or deodorant. Do not shave 48 hours prior to surgery.   Do not bring valuables to the hospital. Do not wear nail polish, gel polish, artificial nails, or any other type of covering on natural nails (fingers and toes) If you have artificial nails or gel coating that need to be removed by a nail salon, please have this removed prior to surgery. Artificial nails or gel coating may interfere with anesthesia's ability to adequately monitor your vital signs.  [facility] is not responsible for any belongings or valuables. .   Do NOT Smoke (Tobacco/Vaping)  24 hours prior to your procedure  If you use a CPAP at night, you may bring your mask for your overnight stay.   Contacts, glasses, hearing aids, dentures or partials may not be worn into surgery, please bring cases for these belongings   For patients admitted to the hospital, discharge time will be determined by your  treatment team.   Patients discharged the day of surgery will not be allowed to drive home, and someone needs to stay with them for 24 hours.   SURGICAL WAITING ROOM VISITATION Patients having surgery or a procedure may have no more than 2 support people in the waiting area - these visitors may rotate.   Children under the age of 57 must have an adult with them who is not the patient. If the patient needs to stay at the hospital during part of their recovery, the visitor guidelines for inpatient rooms apply. Pre-op nurse will coordinate an appropriate time for 1 support person to accompany patient in pre-op.  This support person may not rotate.   Please refer to the [facility] website for the visitor guidelines for Inpatients (after your surgery is over and you are in a regular room).    Special instructions:    Oral Hygiene is also important to reduce your risk of infection.  Remember - BRUSH YOUR TEETH THE MORNING OF SURGERY WITH YOUR REGULAR TOOTHPASTE   [place]- Preparing For Surgery  Before surgery, you can play an important role. Because skin is not sterile, your skin needs to be as free of germs as possible. You can reduce the number of germs on your skin by washing with CHG (chlorahexidine gluconate) Soap before surgery.  CHG is an antiseptic cleaner which kills germs and bonds with the skin to continue killing germs even after washing.  Please do not use if you have an allergy to CHG or antibacterial soaps. If your skin becomes reddened/irritated stop using the CHG.  Do not shave (including legs and underarms) for at least 48 hours prior to first CHG shower. It is OK to shave your face.  Please follow these instructions carefully.     Shower the NIGHT BEFORE SURGERY and the MORNING OF SURGERY with CHG Soap.   If you chose to wash your hair, wash your hair first as usual with your normal shampoo. After you shampoo, rinse your hair and body thoroughly to remove the  shampoo.  Then [employer] and genitals (private parts) with your normal soap and rinse thoroughly to remove soap.  After that Use CHG Soap as you would any other liquid soap. You can apply CHG directly to the skin and wash gently with a scrungie or a clean washcloth.   Apply the CHG Soap to your body ONLY FROM THE NECK DOWN.  Do not use on open wounds or open sores. Avoid contact with your eyes, ears, mouth and genitals (private parts). Wash Face and genitals (private parts)  with your normal soap.   Wash thoroughly, paying special attention to the area where your surgery will be performed.  Thoroughly rinse your body with warm water from the neck down.  DO NOT shower/wash with your normal soap after using and rinsing off the CHG Soap.  Pat yourself dry with a CLEAN TOWEL.  Wear CLEAN PAJAMAS to bed the night before surgery  Place CLEAN SHEETS on your bed the night before your surgery  DO NOT SLEEP WITH PETS.   Day of Surgery:  Take a shower with CHG soap. Wear Clean/Comfortable clothing the morning of surgery Do not apply any deodorants/lotions.   Remember to brush your teeth WITH YOUR REGULAR TOOTHPASTE.    If you received a COVID test during your pre-op visit, it is requested that you wear a mask when out in public, stay away from anyone that may not be feeling well, and notify your surgeon if you develop symptoms. If you have been in contact with anyone that has tested positive in the last 10 days, please notify your surgeon.    Please read over the following fact sheets that you were given.

## 2021-07-28 ENCOUNTER — Encounter (HOSPITAL_COMMUNITY): Payer: Self-pay

## 2021-07-28 ENCOUNTER — Encounter (HOSPITAL_COMMUNITY)
Admission: RE | Admit: 2021-07-28 | Discharge: 2021-07-28 | Disposition: A | Discharge status: Home / Self Care | Payer: Medicare HMO | Source: Ambulatory Visit | Attending: Surgery | Admitting: Surgery

## 2021-07-28 ENCOUNTER — Other Ambulatory Visit: Payer: Self-pay

## 2021-07-28 VITALS — BP 131/84 | HR 77 | Temp 98.1°F | Resp 17 | Ht 65.0 in | Wt 137.5 lb

## 2021-07-28 DIAGNOSIS — Z01812 Encounter for preprocedural laboratory examination: Secondary | ICD-10-CM | POA: Diagnosis not present

## 2021-07-28 DIAGNOSIS — Z01818 Encounter for other preprocedural examination: Secondary | ICD-10-CM

## 2021-07-28 HISTORY — DX: Unspecified osteoarthritis, unspecified site: M19.90

## 2021-07-28 LAB — CBC
HCT: 38.8 % (ref 36.0–46.0)
Hemoglobin: 12.9 g/dL (ref 12.0–15.0)
MCH: 29.7 pg (ref 26.0–34.0)
MCHC: 33.2 g/dL (ref 30.0–36.0)
MCV: 89.4 fL (ref 80.0–100.0)
Platelets: 333 10*3/uL (ref 150–400)
RBC: 4.34 MIL/uL (ref 3.87–5.11)
RDW: 13.7 % (ref 11.5–15.5)
WBC: 3.9 10*3/uL — ABNORMAL LOW (ref 4.0–10.5)
nRBC: 0 % (ref 0.0–0.2)

## 2021-07-28 NOTE — Progress Notes (Signed)
PCP - Velna Hatchet MD Cardiologist - denies  PPM/ICD - denies Device Orders -  Rep Notified -   Chest x-ray - na EKG - na Stress Test -denies ECHO - denies Cardiac Cath - denies  Sleep Study - denies CPAP -   Fasting Blood Sugar - na Checks Blood Sugar _____ times a day  Blood Thinner Instructions:na Aspirin Instructions:na  ERAS Protcol -clear liquids until 1100. PRE-SURGERY Ensure or G2- no  COVID TEST- na   Anesthesia review: yes-seed patient  Patient denies shortness of breath, fever, cough and chest pain at PAT appointment   All instructions explained to the patient, with a verbal understanding of the material. Patient agrees to go over the instructions while at home for a better understanding. Patient also instructed to wear a mask when out in public prior to surgery . The opportunity to ask questions was provided.

## 2021-08-02 ENCOUNTER — Ambulatory Visit
Admission: RE | Admit: 2021-08-02 | Discharge: 2021-08-02 | Disposition: A | Discharge status: Home / Self Care | Payer: Medicare HMO | Source: Ambulatory Visit | Attending: Surgery | Admitting: Surgery

## 2021-08-02 DIAGNOSIS — N6311 Unspecified lump in the right breast, upper outer quadrant: Secondary | ICD-10-CM

## 2021-08-02 DIAGNOSIS — R928 Other abnormal and inconclusive findings on diagnostic imaging of breast: Secondary | ICD-10-CM | POA: Diagnosis not present

## 2021-08-03 ENCOUNTER — Encounter (HOSPITAL_COMMUNITY)
Admission: RE | Disposition: A | Discharge status: Home / Self Care | Payer: Self-pay | Source: Ambulatory Visit | Attending: Surgery

## 2021-08-03 ENCOUNTER — Ambulatory Visit (HOSPITAL_COMMUNITY)
Admission: RE | Admit: 2021-08-03 | Discharge: 2021-08-03 | Disposition: A | Discharge status: Home / Self Care | Payer: Medicare HMO | Source: Ambulatory Visit | Attending: Surgery | Admitting: Surgery

## 2021-08-03 ENCOUNTER — Encounter (HOSPITAL_COMMUNITY): Payer: Self-pay | Admitting: Surgery

## 2021-08-03 ENCOUNTER — Ambulatory Visit
Admission: RE | Admit: 2021-08-03 | Discharge: 2021-08-03 | Disposition: A | Discharge status: Home / Self Care | Payer: Medicare HMO | Source: Ambulatory Visit | Attending: Surgery | Admitting: Surgery

## 2021-08-03 ENCOUNTER — Ambulatory Visit (HOSPITAL_COMMUNITY): Payer: Medicare HMO | Admitting: Emergency Medicine

## 2021-08-03 ENCOUNTER — Other Ambulatory Visit: Payer: Self-pay

## 2021-08-03 ENCOUNTER — Ambulatory Visit (HOSPITAL_BASED_OUTPATIENT_CLINIC_OR_DEPARTMENT_OTHER): Payer: Medicare HMO | Admitting: Emergency Medicine

## 2021-08-03 DIAGNOSIS — N6489 Other specified disorders of breast: Secondary | ICD-10-CM | POA: Diagnosis not present

## 2021-08-03 DIAGNOSIS — N6311 Unspecified lump in the right breast, upper outer quadrant: Secondary | ICD-10-CM | POA: Diagnosis not present

## 2021-08-03 DIAGNOSIS — M1990 Unspecified osteoarthritis, unspecified site: Secondary | ICD-10-CM | POA: Insufficient documentation

## 2021-08-03 DIAGNOSIS — N6031 Fibrosclerosis of right breast: Secondary | ICD-10-CM | POA: Diagnosis not present

## 2021-08-03 DIAGNOSIS — R928 Other abnormal and inconclusive findings on diagnostic imaging of breast: Secondary | ICD-10-CM | POA: Diagnosis not present

## 2021-08-03 DIAGNOSIS — N6310 Unspecified lump in the right breast, unspecified quadrant: Secondary | ICD-10-CM

## 2021-08-03 DIAGNOSIS — Z9882 Breast implant status: Secondary | ICD-10-CM | POA: Diagnosis not present

## 2021-08-03 DIAGNOSIS — N6011 Diffuse cystic mastopathy of right breast: Secondary | ICD-10-CM | POA: Diagnosis not present

## 2021-08-03 DIAGNOSIS — R921 Mammographic calcification found on diagnostic imaging of breast: Secondary | ICD-10-CM | POA: Diagnosis not present

## 2021-08-03 DIAGNOSIS — N62 Hypertrophy of breast: Secondary | ICD-10-CM | POA: Diagnosis not present

## 2021-08-03 HISTORY — PX: BREAST LUMPECTOMY WITH RADIOACTIVE SEED LOCALIZATION: SHX6424

## 2021-08-03 SURGERY — BREAST LUMPECTOMY WITH RADIOACTIVE SEED LOCALIZATION
Anesthesia: General | Site: Breast | Laterality: Right

## 2021-08-03 MED ORDER — ORAL CARE MOUTH RINSE: 15.0000 mL | Freq: Once | OROMUCOSAL | Status: AC

## 2021-08-03 MED ORDER — CHLORHEXIDINE GLUCONATE 0.12 % MT SOLN
OROMUCOSAL | Status: AC
  Administered 2021-08-03: 15 mL via OROMUCOSAL
  Filled 2021-08-03: qty 15

## 2021-08-03 MED ORDER — DEXAMETHASONE SODIUM PHOSPHATE 10 MG/ML IJ SOLN
INTRAMUSCULAR | Status: AC
  Filled 2021-08-03: qty 1

## 2021-08-03 MED ORDER — LIDOCAINE 2% (20 MG/ML) 5 ML SYRINGE
INTRAMUSCULAR | Status: DC | PRN
  Administered 2021-08-03: 60 mg via INTRAVENOUS

## 2021-08-03 MED ORDER — HYDROMORPHONE HCL 1 MG/ML IJ SOLN: 0.2500 mg | INTRAMUSCULAR | Status: DC | PRN

## 2021-08-03 MED ORDER — MIDAZOLAM HCL 5 MG/5ML IJ SOLN
INTRAMUSCULAR | Status: DC | PRN
  Administered 2021-08-03: 1 mg via INTRAVENOUS

## 2021-08-03 MED ORDER — IBUPROFEN 800 MG PO TABS: 800.0000 mg | ORAL_TABLET | Freq: Three times a day (TID) | ORAL | 0 refills | Status: AC | PRN

## 2021-08-03 MED ORDER — DIPHENHYDRAMINE HCL 50 MG/ML IJ SOLN
12.5000 mg | Freq: Once | INTRAMUSCULAR | Status: AC
  Administered 2021-08-03: 12.5 mg via INTRAVENOUS

## 2021-08-03 MED ORDER — OXYCODONE HCL 5 MG PO TABS: 5.0000 mg | ORAL_TABLET | Freq: Four times a day (QID) | ORAL | 0 refills | Status: AC | PRN

## 2021-08-03 MED ORDER — EPHEDRINE SULFATE-NACL 50-0.9 MG/10ML-% IV SOSY
PREFILLED_SYRINGE | INTRAVENOUS | Status: DC | PRN
  Administered 2021-08-03 (×5): 5 mg via INTRAVENOUS

## 2021-08-03 MED ORDER — OXYCODONE HCL 5 MG/5ML PO SOLN: 5.0000 mg | Freq: Once | ORAL | Status: DC | PRN

## 2021-08-03 MED ORDER — CHLORHEXIDINE GLUCONATE CLOTH 2 % EX PADS: 6.0000 | MEDICATED_PAD | Freq: Once | CUTANEOUS | Status: DC

## 2021-08-03 MED ORDER — VANCOMYCIN HCL IN DEXTROSE 1-5 GM/200ML-% IV SOLN: 1000.0000 mg | Freq: Once | INTRAVENOUS | Status: AC

## 2021-08-03 MED ORDER — 0.9 % SODIUM CHLORIDE (POUR BTL) OPTIME
TOPICAL | Status: DC | PRN
  Administered 2021-08-03: 1000 mL

## 2021-08-03 MED ORDER — ACETAMINOPHEN 500 MG PO TABS
ORAL_TABLET | ORAL | Status: AC
  Administered 2021-08-03: 1000 mg via ORAL
  Filled 2021-08-03: qty 2

## 2021-08-03 MED ORDER — BUPIVACAINE-EPINEPHRINE (PF) 0.25% -1:200000 IJ SOLN
INTRAMUSCULAR | Status: AC
  Filled 2021-08-03: qty 30

## 2021-08-03 MED ORDER — FENTANYL CITRATE (PF) 250 MCG/5ML IJ SOLN
INTRAMUSCULAR | Status: AC
  Filled 2021-08-03: qty 5

## 2021-08-03 MED ORDER — ACETAMINOPHEN 500 MG PO TABS: 1000.0000 mg | ORAL_TABLET | Freq: Once | ORAL | Status: AC

## 2021-08-03 MED ORDER — DEXAMETHASONE SODIUM PHOSPHATE 10 MG/ML IJ SOLN
INTRAMUSCULAR | Status: DC | PRN
  Administered 2021-08-03: 10 mg via INTRAVENOUS

## 2021-08-03 MED ORDER — LACTATED RINGERS IV SOLN: INTRAVENOUS | Status: DC

## 2021-08-03 MED ORDER — MEPERIDINE HCL 25 MG/ML IJ SOLN: 6.2500 mg | INTRAMUSCULAR | Status: DC | PRN

## 2021-08-03 MED ORDER — PROMETHAZINE HCL 25 MG/ML IJ SOLN: 6.2500 mg | INTRAMUSCULAR | Status: DC | PRN

## 2021-08-03 MED ORDER — MIDAZOLAM HCL 2 MG/2ML IJ SOLN: 0.5000 mg | Freq: Once | INTRAMUSCULAR | Status: DC | PRN

## 2021-08-03 MED ORDER — MIDAZOLAM HCL 2 MG/2ML IJ SOLN
INTRAMUSCULAR | Status: AC
Start: 2021-08-03 — End: ?
  Filled 2021-08-03: qty 2

## 2021-08-03 MED ORDER — KETOROLAC TROMETHAMINE 30 MG/ML IJ SOLN
INTRAMUSCULAR | Status: DC | PRN
  Administered 2021-08-03: 15 mg via INTRAVENOUS

## 2021-08-03 MED ORDER — KETOROLAC TROMETHAMINE 30 MG/ML IJ SOLN
INTRAMUSCULAR | Status: AC
  Filled 2021-08-03: qty 1

## 2021-08-03 MED ORDER — EPHEDRINE 5 MG/ML INJ
INTRAVENOUS | Status: AC
  Filled 2021-08-03: qty 5

## 2021-08-03 MED ORDER — LIDOCAINE 2% (20 MG/ML) 5 ML SYRINGE
INTRAMUSCULAR | Status: AC
  Filled 2021-08-03: qty 5

## 2021-08-03 MED ORDER — BUPIVACAINE-EPINEPHRINE 0.25% -1:200000 IJ SOLN
INTRAMUSCULAR | Status: DC | PRN
  Administered 2021-08-03: 10 mL

## 2021-08-03 MED ORDER — CHLORHEXIDINE GLUCONATE 0.12 % MT SOLN: 15.0000 mL | Freq: Once | OROMUCOSAL | Status: AC

## 2021-08-03 MED ORDER — DIPHENHYDRAMINE HCL 50 MG/ML IJ SOLN
INTRAMUSCULAR | Status: DC
Start: 2021-08-03 — End: 2021-08-03
  Filled 2021-08-03: qty 1

## 2021-08-03 MED ORDER — PROPOFOL 10 MG/ML IV BOLUS
INTRAVENOUS | Status: DC | PRN
  Administered 2021-08-03: 150 mg via INTRAVENOUS

## 2021-08-03 MED ORDER — ONDANSETRON HCL 4 MG/2ML IJ SOLN
INTRAMUSCULAR | Status: AC
  Filled 2021-08-03: qty 2

## 2021-08-03 MED ORDER — ONDANSETRON HCL 4 MG/2ML IJ SOLN
INTRAMUSCULAR | Status: DC | PRN
  Administered 2021-08-03: 4 mg via INTRAVENOUS

## 2021-08-03 MED ORDER — OXYCODONE HCL 5 MG PO TABS: 5.0000 mg | ORAL_TABLET | Freq: Once | ORAL | Status: DC | PRN

## 2021-08-03 MED ORDER — FENTANYL CITRATE (PF) 100 MCG/2ML IJ SOLN
INTRAMUSCULAR | Status: DC | PRN
  Administered 2021-08-03: 50 ug via INTRAVENOUS

## 2021-08-03 MED ORDER — PROPOFOL 10 MG/ML IV BOLUS
INTRAVENOUS | Status: AC
  Filled 2021-08-03: qty 20

## 2021-08-03 MED ORDER — VANCOMYCIN HCL IN DEXTROSE 1-5 GM/200ML-% IV SOLN
INTRAVENOUS | Status: AC
  Administered 2021-08-03: 1000 mg via INTRAVENOUS
  Filled 2021-08-03: qty 200

## 2021-08-03 SURGICAL SUPPLY — 37 items
ADH SKN CLS APL DERMABOND .7 (GAUZE/BANDAGES/DRESSINGS) ×1
APL PRP STRL LF DISP 70% ISPRP (MISCELLANEOUS) ×1
BAG COUNTER SPONGE SURGICOUNT (BAG) ×2 IMPLANT
BAG SPNG CNTER NS LX DISP (BAG) ×1
BINDER BREAST LRG (GAUZE/BANDAGES/DRESSINGS) IMPLANT
BINDER BREAST XLRG (GAUZE/BANDAGES/DRESSINGS) ×1 IMPLANT
CANISTER SUCT 3000ML PPV (MISCELLANEOUS) IMPLANT
CHLORAPREP W/TINT 26 (MISCELLANEOUS) ×2 IMPLANT
COVER PROBE W GEL 5X96 (DRAPES) ×2 IMPLANT
COVER SURGICAL LIGHT HANDLE (MISCELLANEOUS) ×2 IMPLANT
DERMABOND ADVANCED (GAUZE/BANDAGES/DRESSINGS) ×1
DERMABOND ADVANCED .7 DNX12 (GAUZE/BANDAGES/DRESSINGS) ×1 IMPLANT
DEVICE DUBIN SPECIMEN MAMMOGRA (MISCELLANEOUS) ×2 IMPLANT
DRAPE CHEST BREAST 15X10 FENES (DRAPES) ×2 IMPLANT
ELECT CAUTERY BLADE 6.4 (BLADE) ×2 IMPLANT
ELECT REM PT RETURN 9FT ADLT (ELECTROSURGICAL) ×2
ELECTRODE REM PT RTRN 9FT ADLT (ELECTROSURGICAL) ×1 IMPLANT
GLOVE BIO SURGEON STRL SZ8 (GLOVE) ×2 IMPLANT
GLOVE BIOGEL PI IND STRL 8 (GLOVE) ×1 IMPLANT
GLOVE BIOGEL PI INDICATOR 8 (GLOVE) ×1
GOWN STRL REUS W/ TWL LRG LVL3 (GOWN DISPOSABLE) ×1 IMPLANT
GOWN STRL REUS W/ TWL XL LVL3 (GOWN DISPOSABLE) ×1 IMPLANT
GOWN STRL REUS W/TWL LRG LVL3 (GOWN DISPOSABLE) ×2
GOWN STRL REUS W/TWL XL LVL3 (GOWN DISPOSABLE) ×2
KIT BASIN OR (CUSTOM PROCEDURE TRAY) ×2 IMPLANT
KIT MARKER MARGIN INK (KITS) ×1 IMPLANT
NDL HYPO 25GX1X1/2 BEV (NEEDLE) IMPLANT
NEEDLE HYPO 25GX1X1/2 BEV (NEEDLE) IMPLANT
NS IRRIG 1000ML POUR BTL (IV SOLUTION) IMPLANT
PACK GENERAL/GYN (CUSTOM PROCEDURE TRAY) ×2 IMPLANT
SUT MNCRL AB 4-0 PS2 18 (SUTURE) ×2 IMPLANT
SUT VIC AB 2-0 SH 27 (SUTURE) ×2
SUT VIC AB 2-0 SH 27XBRD (SUTURE) IMPLANT
SUT VIC AB 3-0 SH 27 (SUTURE) ×2
SUT VIC AB 3-0 SH 27X BRD (SUTURE) IMPLANT
SYR CONTROL 10ML LL (SYRINGE) IMPLANT
TOWEL GREEN STERILE FF (TOWEL DISPOSABLE) IMPLANT

## 2021-08-03 NOTE — Anesthesia Procedure Notes (Signed)
Procedure Name: LMA Insertion Date/Time: 08/03/2021 2:18 PM  Performed by: Gwyndolyn Saxon, CRNAPre-anesthesia Checklist: Patient identified, Emergency Drugs available, Suction available and Patient being monitored Patient Re-evaluated:Patient Re-evaluated prior to induction Oxygen Delivery Method: Circle system utilized Preoxygenation: Pre-oxygenation with 100% oxygen Induction Type: IV induction Ventilation: Mask ventilation without difficulty LMA: LMA inserted LMA Size: 4.0 Number of attempts: 1 Placement Confirmation: positive ETCO2 and breath sounds checked- equal and bilateral Tube secured with: Tape Dental Injury: Teeth and Oropharynx as per pre-operative assessment

## 2021-08-03 NOTE — Anesthesia Preprocedure Evaluation (Addendum)
Anesthesia Evaluation  Patient identified by MRN, date of birth, ID band Patient awake    Reviewed: Allergy & Precautions, NPO status , Patient's Chart, lab work & pertinent test results  Airway Mallampati: II  TM Distance: >3 FB Neck ROM: Full    Dental no notable dental hx.    Pulmonary neg pulmonary ROS,    Pulmonary exam normal        Cardiovascular negative cardio ROS   Rhythm:Regular Rate:Normal     Neuro/Psych negative neurological ROS  negative psych ROS   GI/Hepatic negative GI ROS, Neg liver ROS,   Endo/Other  negative endocrine ROS  Renal/GU negative Renal ROS  negative genitourinary   Musculoskeletal  (+) Arthritis , Osteoarthritis,  Right breast mass   Abdominal Normal abdominal exam  (+)   Peds  Hematology negative hematology ROS (+)   Anesthesia Other Findings   Reproductive/Obstetrics                             Anesthesia Physical Anesthesia Plan  ASA: 1  Anesthesia Plan: General   Post-op Pain Management: Tylenol PO (pre-op)*   Induction:   PONV Risk Score and Plan: 3 and Ondansetron, Dexamethasone and Treatment may vary due to age or medical condition  Airway Management Planned: LMA and Mask  Additional Equipment: None  Intra-op Plan:   Post-operative Plan: Extubation in OR  Informed Consent: I have reviewed the patients History and Physical, chart, labs and discussed the procedure including the risks, benefits and alternatives for the proposed anesthesia with the patient or authorized representative who has indicated his/her understanding and acceptance.     Dental advisory given  Plan Discussed with: CRNA  Anesthesia Plan Comments: (Lab Results      Component                Value               Date                      WBC                      3.9 (L)             07/28/2021                HGB                      12.9                07/28/2021                 HCT                      38.8                07/28/2021                MCV                      89.4                07/28/2021                PLT                      333  07/28/2021          )       Anesthesia Quick Evaluation

## 2021-08-03 NOTE — Progress Notes (Signed)
Per. Dr. Gloris Manchester, patient received Benadryl 12.5 mg IV and Vancomycin was restarted at 100 ml/h. Will continue to monitor.

## 2021-08-03 NOTE — Discharge Instructions (Signed)
[facility] Office Phone Number [phone]  BREAST BIOPSY/ PARTIAL MASTECTOMY: POST OP INSTRUCTIONS  Always review your discharge instruction sheet given to you by the facility where your surgery was performed.  IF YOU HAVE DISABILITY OR FAMILY LEAVE FORMS, YOU MUST BRING THEM TO THE OFFICE FOR PROCESSING.  DO NOT GIVE THEM TO YOUR DOCTOR.  A prescription for pain medication may be given to you upon discharge.  Take your pain medication as prescribed, if needed.  If narcotic pain medicine is not needed, then you may take acetaminophen (Tylenol) or ibuprofen (Advil) as needed. Take your usually prescribed medications unless otherwise directed If you need a refill on your pain medication, please contact your pharmacy.  They will contact our office to request authorization.  Prescriptions will not be filled after 5pm or on week-ends. You should eat very light the first 24 hours after surgery, such as soup, crackers, pudding, etc.  Resume your normal diet the day after surgery. Most patients will experience some swelling and bruising in the breast.  Ice packs and a good support bra will help.  Swelling and bruising can take several days to resolve.  It is common to experience some constipation if taking pain medication after surgery.  Increasing fluid intake and taking a stool softener will usually help or prevent this problem from occurring.  A mild laxative (Milk of Magnesia or Miralax) should be taken according to package directions if there are no bowel movements after 48 hours. Unless discharge instructions indicate otherwise, you may remove your bandages 24-48 hours after surgery, and you may shower at that time.  You may have steri-strips (small skin tapes) in place directly over the incision.  These strips should be left on the skin for 7-10 days.  If your surgeon used skin glue on the incision, you may shower in 24 hours.  The glue will flake off over the next 2-3 weeks.  Any  sutures or staples will be removed at the office during your follow-up visit. ACTIVITIES:  You may resume regular daily activities (gradually increasing) beginning the next day.  Wearing a good support bra or sports bra minimizes pain and swelling.  You may have sexual intercourse when it is comfortable. You may drive when you no longer are taking prescription pain medication, you can comfortably wear a seatbelt, and you can safely maneuver your car and apply brakes. RETURN TO WORK:  ______________________________________________________________________________________ You should see your doctor in the office for a follow-up appointment approximately two weeks after your surgery.  Your doctor's nurse will typically make your follow-up appointment when she calls you with your pathology report.  Expect your pathology report 2-3 business days after your surgery.  You may call to check if you do not hear from us after three days. OTHER INSTRUCTIONS: _______________________________________________________________________________________________ _____________________________________________________________________________________________________________________________________ _____________________________________________________________________________________________________________________________________ _____________________________________________________________________________________________________________________________________  WHEN TO CALL YOUR DOCTOR: Fever over 101.0 Nausea and/or vomiting. Extreme swelling or bruising. Continued bleeding from incision. Increased pain, redness, or drainage from the incision.  The clinic staff is available to answer your questions during regular business hours.  Please don't hesitate to call and ask to speak to one of the nurses for clinical concerns.  If you have a medical emergency, go to the nearest emergency room or call 911.  A surgeon from [facility] is always on call at the hospital.  For further questions, please visit [email or web address]   

## 2021-08-03 NOTE — Progress Notes (Signed)
Patient was receiving Vancomycin IV and she was complaining of itching. Per Dr. Glennon Mac, the IV was stopped for few minutes and will be restarted at a lower rate. No other signs of allergic reaction. Will continue to monitor.

## 2021-08-03 NOTE — H&P (Signed)
Chief Complaint: New Consultation (Right Breast )   History of Present Illness: Diana [name] is a 71 y.o. female who is seen today as an office consultation for evaluation of New Consultation (Right Breast ) .   Patient presents for evaluation of right breast mass. She underwent screening mammogram which showed a density upper outer quadrant and an area of what appeared to be dilation of small ducts and intraductal masses. Core biopsy showed benign findings. Spoke with discordant therefore she was referred for right breast lumpectomy. She also has bilateral silicone implants she would like to have it removed. Denies any history of breast pain, breast mass or nipple discharge. She has significant contracture of her implants that are about 109 years old at Dr. Stephanie Coup placed. She is eager to have these removed if possible. No family history of breast cancer noted.  Review of Systems: A complete review of systems was obtained from the patient. I have reviewed this information and discussed as appropriate with the patient. See HPI as well for other ROS.    Medical History: Past Medical History:  Diagnosis Date   Arthritis   There is no problem list on file for this patient.  Past Surgical History:  Procedure Laterality Date   HYSTERECTOMY 1999   Breast Implants Bilateral 2004   JOINT REPLACEMENT  Hip x 2 2022,2023    Allergies  Allergen Reactions   Penicillins Hives and Itching   Current Outpatient Medications on File Prior to Visit  Medication Sig Dispense Refill   celecoxib (CELEBREX) 200 MG capsule   cholecalciferol 1000 unit tablet Take by mouth   estradiol (VIVELLE-DOT) patch 0.1 mg/24 hr estradiol 0.1 mg/24 hr semiweekly transdermal patch   No current facility-administered medications on file prior to visit.   History reviewed. No pertinent family history.   Social History   Tobacco Use  Smoking Status Never  Smokeless Tobacco Never    Social History    Socioeconomic History   Marital status: Married  Tobacco Use   Smoking status: Never   Smokeless tobacco: Never  Substance and Sexual Activity   Alcohol use: Yes   Drug use: Never   Objective:  There were no vitals filed for this visit.  There is no height or weight on file to calculate BMI.  Physical Exam Eyes:  General: No scleral icterus. Cardiovascular:  Rate and Rhythm: Normal rate.  Chest:  Comments: Bilateral implants noted with contracture. Significant ptosis of both breast noted  No masses noted. Minimal bruising right breast noted. Musculoskeletal:  Cervical back: Normal range of motion.  Lymphadenopathy:  Upper Body:  Right upper body: No supraclavicular or axillary adenopathy.  Left upper body: No supraclavicular or axillary adenopathy.  Neurological:  General: No focal deficit present.  Mental Status: She is alert and oriented to person, place, and time.     Labs, Imaging and Diagnostic Testing: ADDENDUM REPORT: 05/20/2021 15:43   ADDENDUM: Pathology revealed COLUMNAR CELL AND FIBROCYSTIC CHANGES WITH FEATURES CONSISTENT WITH CYSTIC RUPTURE of the RIGHT breast, 10:00 o'clock, 2 cmfn, (ribbon clip). This was found to be discordant by Dr. Nolon Nations, with excision recommended.   Pathology results were discussed with the patient by telephone. The patient reported doing well after the biopsy with minimal tenderness at the site. Post biopsy instructions and care were reviewed and questions were answered. The patient was encouraged to call [facility] for any additional concerns. My direct phone number was provided.   Surgical consultation  has been arranged with Dr. Erroll Luna at [facility] on June 11, 2021.   Pathology results reported by Terie Purser, RN on 05/20/2021.     Electronically Signed By: Nolon Nations M.D. On: 05/20/2021 15:43    Addended by Nolon Nations, MD on 05/20/2021  5:43 PM    Study Result  Narrative & Impression  CLINICAL DATA: Patient presents for ultrasound-guided core biopsy of RIGHT breast mass.   EXAM: ULTRASOUND GUIDED RIGHT BREAST CORE NEEDLE BIOPSY   COMPARISON: Prior studies   PROCEDURE: I met with the patient and we discussed the procedure of ultrasound-guided biopsy, including benefits and alternatives. We discussed the high likelihood of a successful procedure. We discussed the risks of the procedure, including infection, bleeding, tissue injury, clip migration, and inadequate sampling. We also discussed the unlikely risk of implant rupture. Informed written consent was given. The usual time-out protocol was performed immediately prior to the procedure.   Lesion quadrant: UPPER INNER QUADRANT RIGHT breast   Using sterile technique and 1% Lidocaine as local anesthetic, under direct ultrasound visualization, a 12 gauge spring-loaded device was used to perform biopsy of intraductal mass in the 10 o'clock location of the RIGHT breast using a inferior to superior approach. At the conclusion of the procedure ribbon tissue marker clip was deployed into the biopsy cavity. Follow up 2 view mammogram was performed and dictated separately.   IMPRESSION: Ultrasound guided biopsy of RIGHT breast mass. No apparent complications.   Electronically Signed: By: Nolon Nations M.D. On: 05/19/2021 13:26   Assessment and Plan:   Diagnoses and all orders for this visit:  Capsular contracture of breast implant, initial encounter - Ambulatory Referral to Plastic Surgery    Given discordant findings recommend right breast seed lumpectomy. She also would like to have both implants removed therefore referred to plastic surgery to see if this can be a coordinated procedure. Risk lumpectomy include bleeding, infection, seed use, cosmetic deformity, wound complications, cardiovascular location and anesthesia complications and the need for treatments  and/or procedures depending on final pathology.  No follow-ups on file.  Kennieth Francois, MD

## 2021-08-03 NOTE — Interval H&P Note (Signed)
History and Physical Interval Note:  08/03/2021 1:32 PM  Diana [name]  has presented today for surgery, with the diagnosis of RIGHT BREAST MASS.  The various methods of treatment have been discussed with the patient and family. After consideration of risks, benefits and other options for treatment, the patient has consented to  Procedure(s): RIGHT BREAST LUMPECTOMY WITH RADIOACTIVE SEED LOCALIZATION (Right) as a surgical intervention.  The patient's history has been reviewed, patient examined, no change in status, stable for surgery.  I have reviewed the patient's chart and labs.  Questions were answered to the patient's satisfaction.     [place]

## 2021-08-03 NOTE — Transfer of Care (Signed)
Immediate Anesthesia Transfer of Care Note  Patient: ELISANDRA DESHMUKH  Procedure(s) Performed: RIGHT BREAST LUMPECTOMY WITH RADIOACTIVE SEED LOCALIZATION (Right: Breast)  Patient Location: PACU  Anesthesia Type:General  Level of Consciousness: drowsy  Airway & Oxygen Therapy: Patient Spontanous Breathing and Patient connected to nasal cannula oxygen  Post-op Assessment: Report given to RN and Post -op Vital signs reviewed and stable  Post vital signs: Reviewed and stable  Last Vitals:  Vitals Value Taken Time  BP 117/61 08/03/21 1507  Temp    Pulse 62 08/03/21 1507  Resp 10 08/03/21 1510  SpO2 100 % 08/03/21 1507  Vitals shown include unvalidated device data.  Last Pain:  Vitals:   08/03/21 1240  TempSrc:   PainSc: 0-No pain         Complications: No notable events documented.

## 2021-08-03 NOTE — Op Note (Signed)
Preoperative diagnosis: Right breast mass discordant  Postoperative diagnosis: Right breast mass discordant upper outer quadrant  Procedure: Right breast seed localized lumpectomy  Surgeon: Erroll Luna, MD  Anesthesia: LMA with 0.25% Marcaine plain  EBL: Minimal  Specimen: Right breast tissue with seed and clip verified by Faxitron    Assistant: Dr. Clerance Lav MD   I was personally present and performed the key and critical portions of this procedure and immediately available throughout the entire procedure, as documented in my operative note.    Indications for procedure: The patient is a 71 year old female who had a right breast mass.  Core biopsy showed benign findings with this felt to be discordant and excision recommended by the radiologist.  She presents today for that.  History of previous breast implant so we discussed potential injury to her implants.  She also saw plastic surgery to discuss this as well.  She will plan for implant removal later.  The procedure has been discussed with the patient. Alternatives to surgery have been discussed with the patient.  Risks of surgery include bleeding,  Infection,  Seroma formation, death,  and the need for further surgery.   The patient understands and wishes to proceed.      Description of procedure: The patient was met in the holding area and questions were answered.  Right breast was marked as correct site.  A seed was placed as an outpatient.  Films were available for review.  She was then taken back to the operative room.  She is placed upon upon the OR table.  After induction of general anesthesia, right breast was prepped and draped in sterile fashion.  Timeout performed.  Neoprobe used identify the seed right breast upper outer quadrant.  Local anesthetic was infiltrated around the right nipple-areolar complex.  Curvilinear incision was made around this.  Dissection was carried down all tissue and the seed and clip  were excised with a grossly negative margin.  The implant was not damaged.  The image revealed the seed and clip to be in the specimen of the Faxitron.  This was oriented with ink and sent to pathology.  Cavity is found to be hemostatic.  Local anesthetic infiltrated.  The cavity is closed with a deep layer 3-0 Vicryl.  4 Monocryl was used to close the skin.  Dermabond applied.  All counts found to be correct.  The patient was awoke extubated taken to recovery in satisfactory condition.

## 2021-08-04 ENCOUNTER — Encounter (HOSPITAL_COMMUNITY): Payer: Self-pay | Admitting: Surgery

## 2021-08-04 NOTE — Anesthesia Postprocedure Evaluation (Signed)
Anesthesia Post Note  Patient: Diana [name]  Procedure(s) Performed: RIGHT BREAST LUMPECTOMY WITH RADIOACTIVE SEED LOCALIZATION (Right: Breast)     Patient location during evaluation: PACU Anesthesia Type: General Level of consciousness: awake and alert Pain management: pain level controlled Vital Signs Assessment: post-procedure vital signs reviewed and stable Respiratory status: spontaneous breathing, nonlabored ventilation, respiratory function stable and patient connected to nasal cannula oxygen Cardiovascular status: blood pressure returned to baseline and stable Postop Assessment: no apparent nausea or vomiting Anesthetic complications: no   No notable events documented.  Last Vitals:  Vitals:   08/03/21 1520 08/03/21 1535  BP: 126/70 126/67  Pulse: 61 62  Resp: 12 13  Temp:  36.7 C  SpO2: 100% 99%    Last Pain:  Vitals:   08/03/21 1535  TempSrc:   PainSc: 0-No pain                 Belenda Cruise P Christy Friede

## 2021-08-05 LAB — SURGICAL PATHOLOGY

## 2021-08-06 ENCOUNTER — Encounter: Payer: Self-pay | Admitting: Surgery

## 2021-08-09 DIAGNOSIS — H2513 Age-related nuclear cataract, bilateral: Secondary | ICD-10-CM | POA: Diagnosis not present

## 2021-08-09 DIAGNOSIS — H524 Presbyopia: Secondary | ICD-10-CM | POA: Diagnosis not present

## 2021-08-10 ENCOUNTER — Encounter (HOSPITAL_COMMUNITY): Payer: Self-pay

## 2021-08-28 NOTE — Progress Notes (Signed)
Pre-operative CBC work-up obtained for baseline prior to surgery.

## 2022-04-22 DIAGNOSIS — R7989 Other specified abnormal findings of blood chemistry: Secondary | ICD-10-CM | POA: Diagnosis not present

## 2022-04-22 DIAGNOSIS — R030 Elevated blood-pressure reading, without diagnosis of hypertension: Secondary | ICD-10-CM | POA: Diagnosis not present

## 2022-04-22 DIAGNOSIS — E785 Hyperlipidemia, unspecified: Secondary | ICD-10-CM | POA: Diagnosis not present

## 2022-05-04 DIAGNOSIS — M1990 Unspecified osteoarthritis, unspecified site: Secondary | ICD-10-CM | POA: Diagnosis not present

## 2022-05-04 DIAGNOSIS — E785 Hyperlipidemia, unspecified: Secondary | ICD-10-CM | POA: Diagnosis not present

## 2022-05-04 DIAGNOSIS — Z0000 Encounter for general adult medical examination without abnormal findings: Secondary | ICD-10-CM | POA: Diagnosis not present

## 2022-05-04 DIAGNOSIS — Z1331 Encounter for screening for depression: Secondary | ICD-10-CM | POA: Diagnosis not present

## 2022-05-04 DIAGNOSIS — Z1339 Encounter for screening examination for other mental health and behavioral disorders: Secondary | ICD-10-CM | POA: Diagnosis not present

## 2022-05-16 DIAGNOSIS — Z6823 Body mass index (BMI) 23.0-23.9, adult: Secondary | ICD-10-CM | POA: Diagnosis not present

## 2022-05-16 DIAGNOSIS — N959 Unspecified menopausal and perimenopausal disorder: Secondary | ICD-10-CM | POA: Diagnosis not present

## 2022-05-16 DIAGNOSIS — Z01419 Encounter for gynecological examination (general) (routine) without abnormal findings: Secondary | ICD-10-CM | POA: Diagnosis not present

## 2022-08-30 DIAGNOSIS — H2513 Age-related nuclear cataract, bilateral: Secondary | ICD-10-CM | POA: Diagnosis not present

## 2022-09-21 DIAGNOSIS — N958 Other specified menopausal and perimenopausal disorders: Secondary | ICD-10-CM | POA: Diagnosis not present

## 2023-05-02 DIAGNOSIS — E785 Hyperlipidemia, unspecified: Secondary | ICD-10-CM | POA: Diagnosis not present

## 2023-05-09 DIAGNOSIS — E785 Hyperlipidemia, unspecified: Secondary | ICD-10-CM | POA: Diagnosis not present

## 2023-05-09 DIAGNOSIS — M1990 Unspecified osteoarthritis, unspecified site: Secondary | ICD-10-CM | POA: Diagnosis not present

## 2023-05-09 DIAGNOSIS — Z0000 Encounter for general adult medical examination without abnormal findings: Secondary | ICD-10-CM | POA: Diagnosis not present

## 2023-05-09 DIAGNOSIS — Z1339 Encounter for screening examination for other mental health and behavioral disorders: Secondary | ICD-10-CM | POA: Diagnosis not present

## 2023-05-09 DIAGNOSIS — Z1331 Encounter for screening for depression: Secondary | ICD-10-CM | POA: Diagnosis not present

## 2023-06-12 ENCOUNTER — Other Ambulatory Visit: Payer: Self-pay | Admitting: Obstetrics and Gynecology

## 2023-06-12 DIAGNOSIS — Z1231 Encounter for screening mammogram for malignant neoplasm of breast: Secondary | ICD-10-CM

## 2023-06-29 ENCOUNTER — Ambulatory Visit
Admission: RE | Admit: 2023-06-29 | Discharge: 2023-06-29 | Disposition: A | Discharge status: Home / Self Care | Source: Ambulatory Visit | Attending: Obstetrics and Gynecology | Admitting: Obstetrics and Gynecology

## 2023-06-29 DIAGNOSIS — Z1231 Encounter for screening mammogram for malignant neoplasm of breast: Secondary | ICD-10-CM | POA: Diagnosis not present

## 2023-07-12 DIAGNOSIS — Z85828 Personal history of other malignant neoplasm of skin: Secondary | ICD-10-CM | POA: Diagnosis not present

## 2023-07-12 DIAGNOSIS — L821 Other seborrheic keratosis: Secondary | ICD-10-CM | POA: Diagnosis not present

## 2023-07-12 DIAGNOSIS — D225 Melanocytic nevi of trunk: Secondary | ICD-10-CM | POA: Diagnosis not present

## 2023-07-12 DIAGNOSIS — D2261 Melanocytic nevi of right upper limb, including shoulder: Secondary | ICD-10-CM | POA: Diagnosis not present

## 2023-07-12 DIAGNOSIS — L820 Inflamed seborrheic keratosis: Secondary | ICD-10-CM | POA: Diagnosis not present

## 2023-07-12 DIAGNOSIS — D1801 Hemangioma of skin and subcutaneous tissue: Secondary | ICD-10-CM | POA: Diagnosis not present

## 2023-07-12 DIAGNOSIS — L812 Freckles: Secondary | ICD-10-CM | POA: Diagnosis not present

## 2023-08-26 IMAGING — US US  BREAST BX W/ LOC DEV 1ST LESION IMG BX SPEC US GUIDE*R*
1 series · 12 of 12 positions shown · non-contrast
Comparison: Prior studies
COMPARISON: Prior studies

Addendum:
CLINICAL DATA: Patient presents for ultrasound-guided core biopsy
of RIGHT breast mass.

EXAM:
ULTRASOUND GUIDED RIGHT BREAST CORE NEEDLE BIOPSY

[Series 1: us breast bx w/ loc dev 1st lesion img bx spec us  · 0.05mm/px · 12 of 12 slices shown]
[im 1/12]
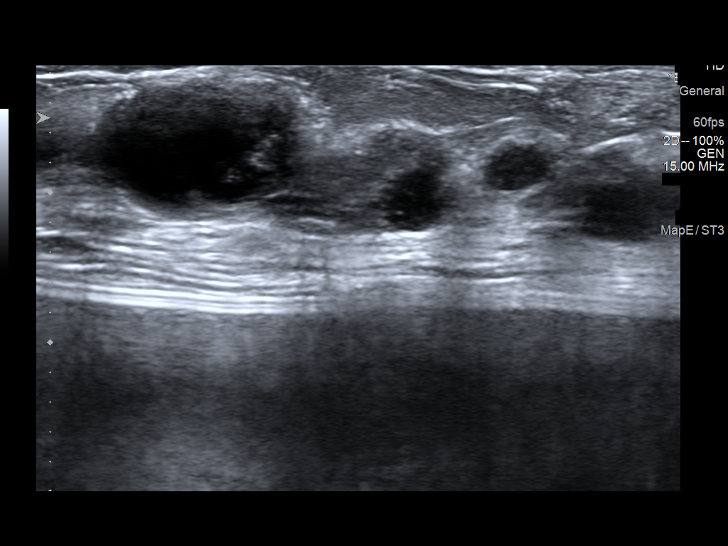
[im 2/12]
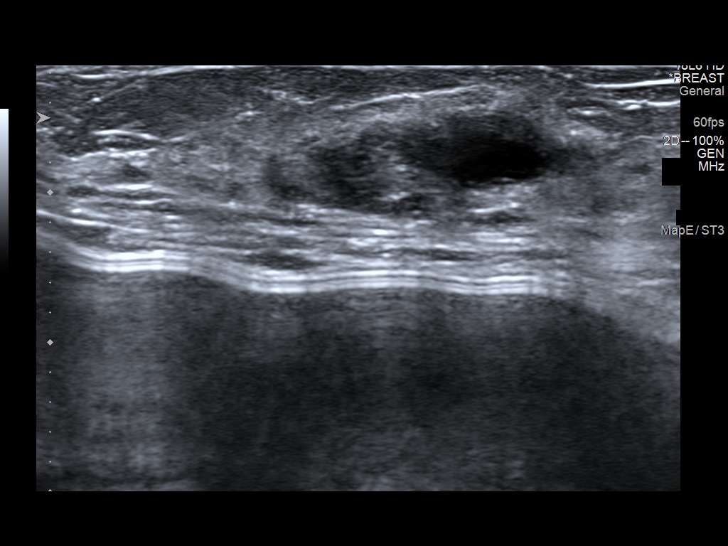
[im 3/12]
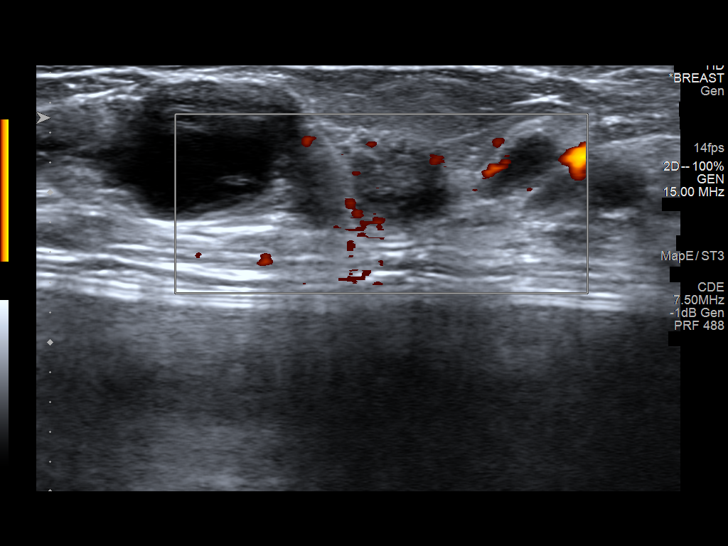
[im 4/12]
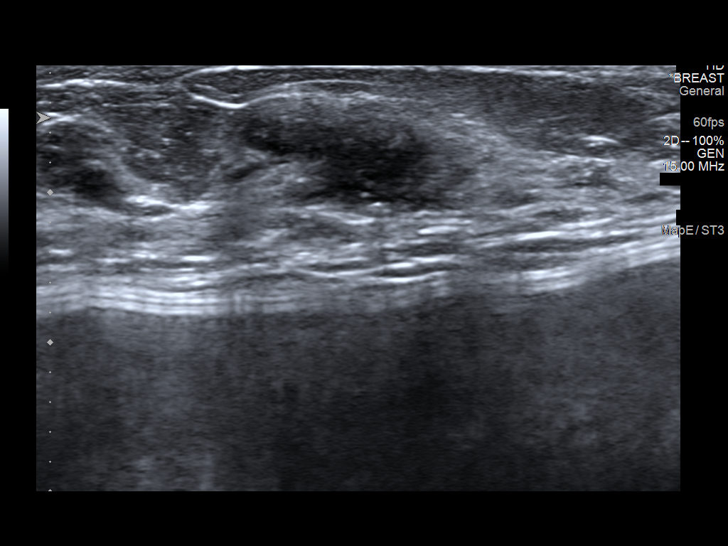
[im 5/12]
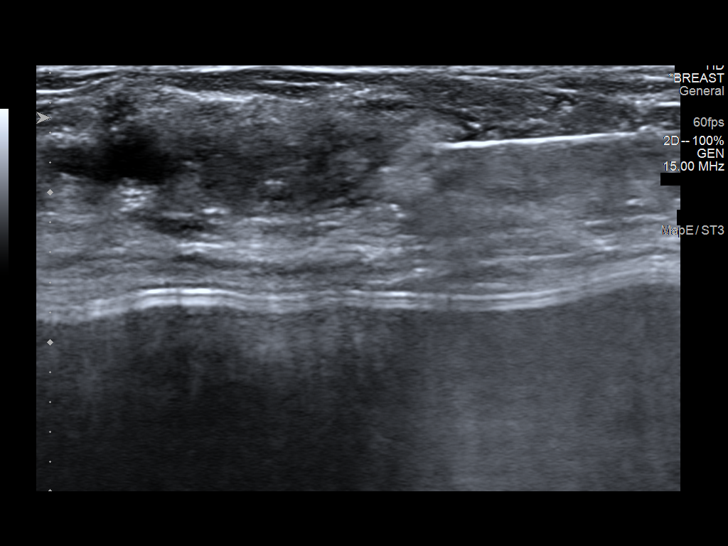
[im 6/12]
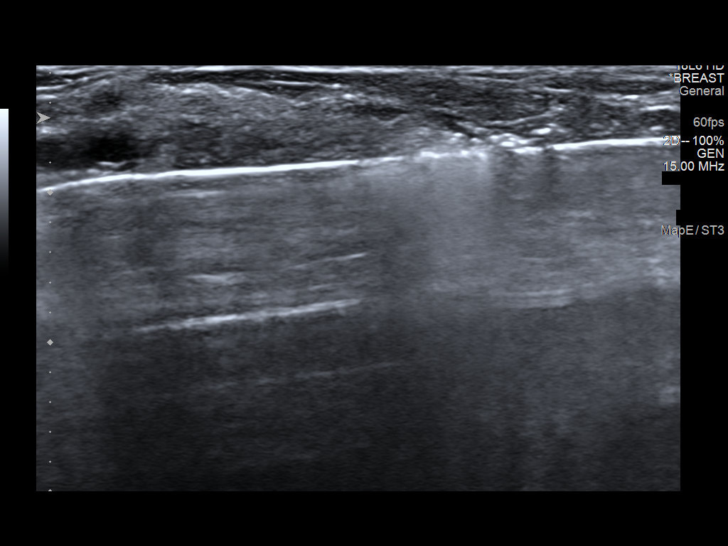
[im 7/12]
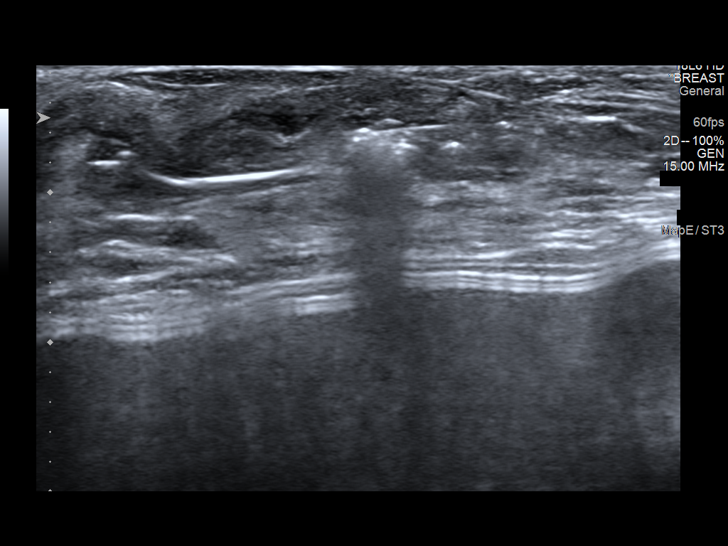
[im 8/12]
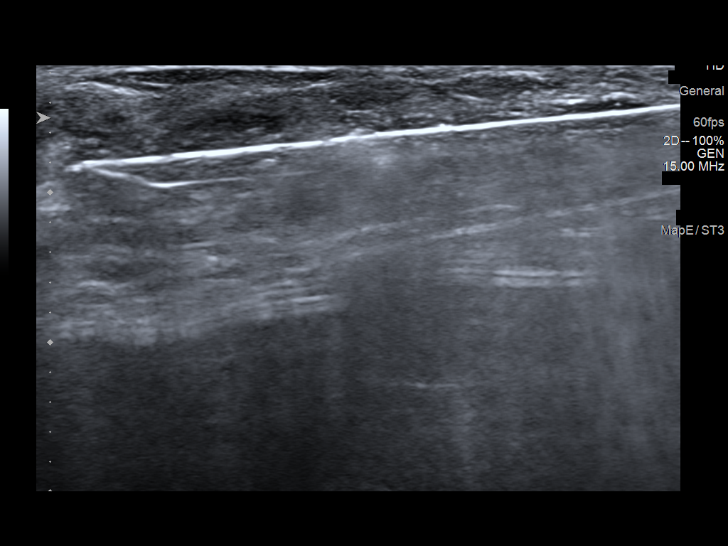
[im 9/12]
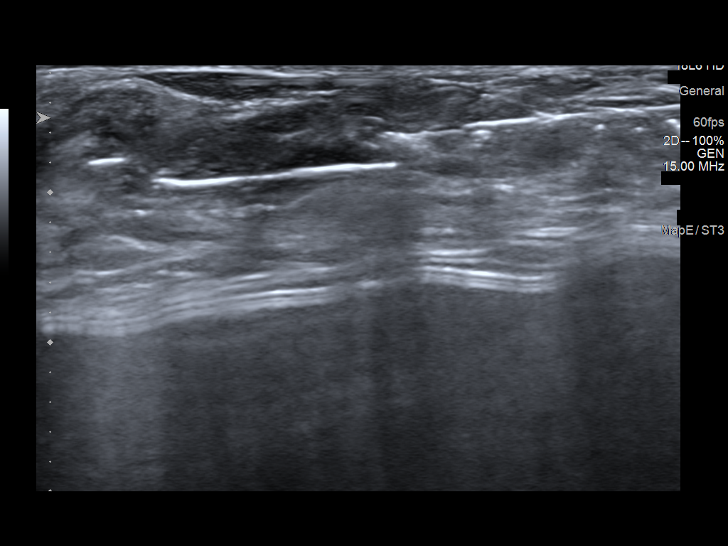
[im 10/12]
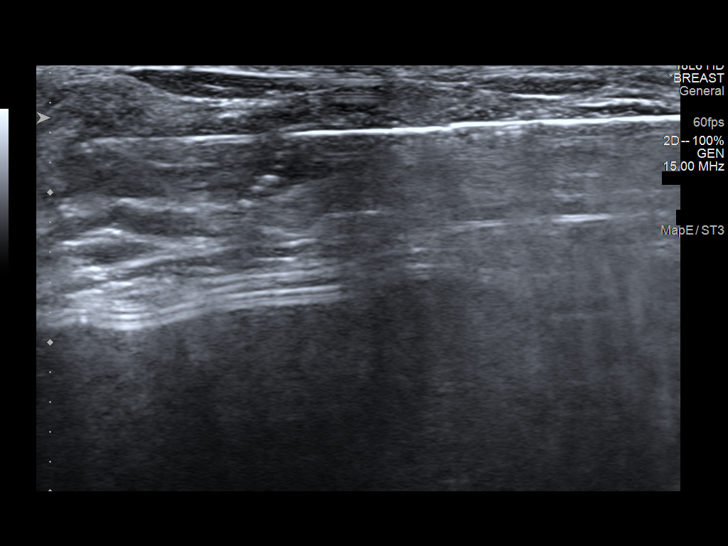
[im 11/12]
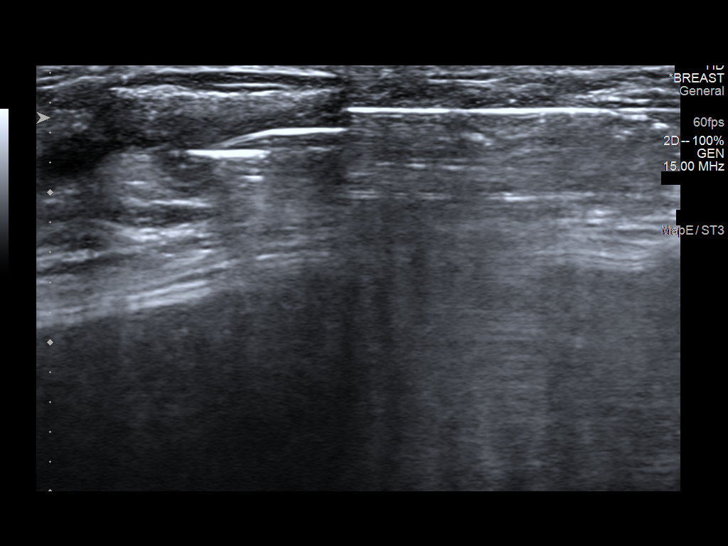
[im 12/12]
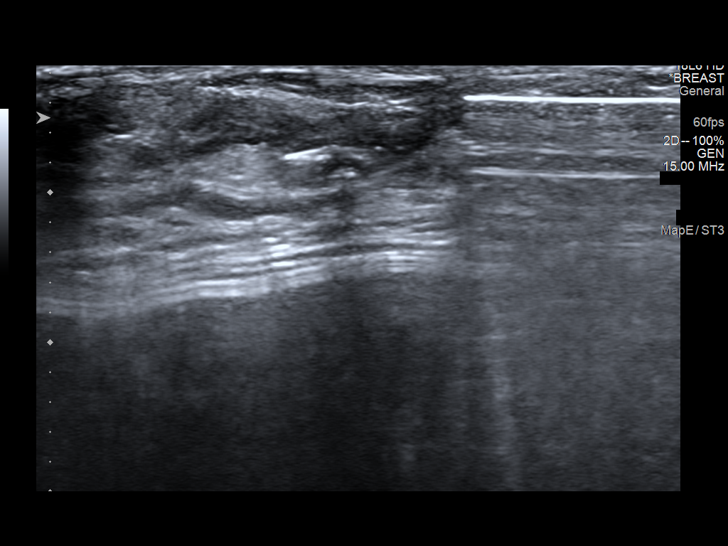

[12 of 12 positions shown; findings below may reference images not displayed]

PROCEDURE:
I met with the patient and we discussed the procedure of
ultrasound-guided biopsy, including benefits and alternatives. We
discussed the high likelihood of a successful procedure. We
discussed the risks of the procedure, including infection, bleeding,
tissue injury, clip migration, and inadequate sampling. We also
discussed the unlikely risk of implant rupture. Informed written
consent was given. The usual time-out protocol was performed
immediately prior to the procedure.

Lesion quadrant: UPPER INNER QUADRANT RIGHT breast

Using sterile technique and 1% Lidocaine as local anesthetic, under
direct ultrasound visualization, a 12 gauge Asadullh device was
used to perform biopsy of intraductal mass in the 10 o'clock
location of the RIGHT breast using a inferior to superior approach.
At the conclusion of the procedure ribbon tissue marker clip was
deployed into the biopsy cavity. Follow up 2 view mammogram was
performed and dictated separately.
IMPRESSION: Ultrasound guided biopsy of RIGHT breast mass. No apparent
complications.

ADDENDUM:
Pathology revealed COLUMNAR CELL AND FIBROCYSTIC CHANGES WITH
FEATURES CONSISTENT WITH CYSTIC RUPTURE of the RIGHT breast, 10:00
o'clock, 2 cmfn, (ribbon clip). This was found to be discordant by
Dr. Jean Pierre Cullins, with excision recommended.

Pathology results were discussed with the patient by telephone. The
patient reported doing well after the biopsy with minimal tenderness
at the site. Post biopsy instructions and care were reviewed and
questions were answered. The patient was encouraged to call The
direct phone number was provided.

Surgical consultation has been arranged with Dr. Magdiel Shedd at
[[facility]ACTED] on June 11, 2021.

Pathology results reported by Danii Aujla, RN on 05/20/2021.

*** End of Addendum ***
PROCEDURE:
I met with the patient and we discussed the procedure of
ultrasound-guided biopsy, including benefits and alternatives. We
discussed the high likelihood of a successful procedure. We
discussed the risks of the procedure, including infection, bleeding,
tissue injury, clip migration, and inadequate sampling. We also
discussed the unlikely risk of implant rupture. Informed written
consent was given. The usual time-out protocol was performed
immediately prior to the procedure.

Lesion quadrant: UPPER INNER QUADRANT RIGHT breast

Using sterile technique and 1% Lidocaine as local anesthetic, under
direct ultrasound visualization, a 12 gauge Asadullh device was
used to perform biopsy of intraductal mass in the 10 o'clock
location of the RIGHT breast using a inferior to superior approach.
At the conclusion of the procedure ribbon tissue marker clip was
deployed into the biopsy cavity. Follow up 2 view mammogram was
performed and dictated separately.
IMPRESSION: Ultrasound guided biopsy of RIGHT breast mass. No apparent
complications.

## 2023-08-26 IMAGING — MG MM BREAST LOCALIZATION CLIP
4 series · 4 of 12 positions shown · non-contrast
Comparison: Previous exam(s).

CLINICAL DATA: Status post ultrasound-guided core biopsy of RIGHT
breast mass.

EXAM:
3D DIAGNOSTIC RIGHT MAMMOGRAM POST ULTRASOUND BIOPSY

[R ML synth-2D]
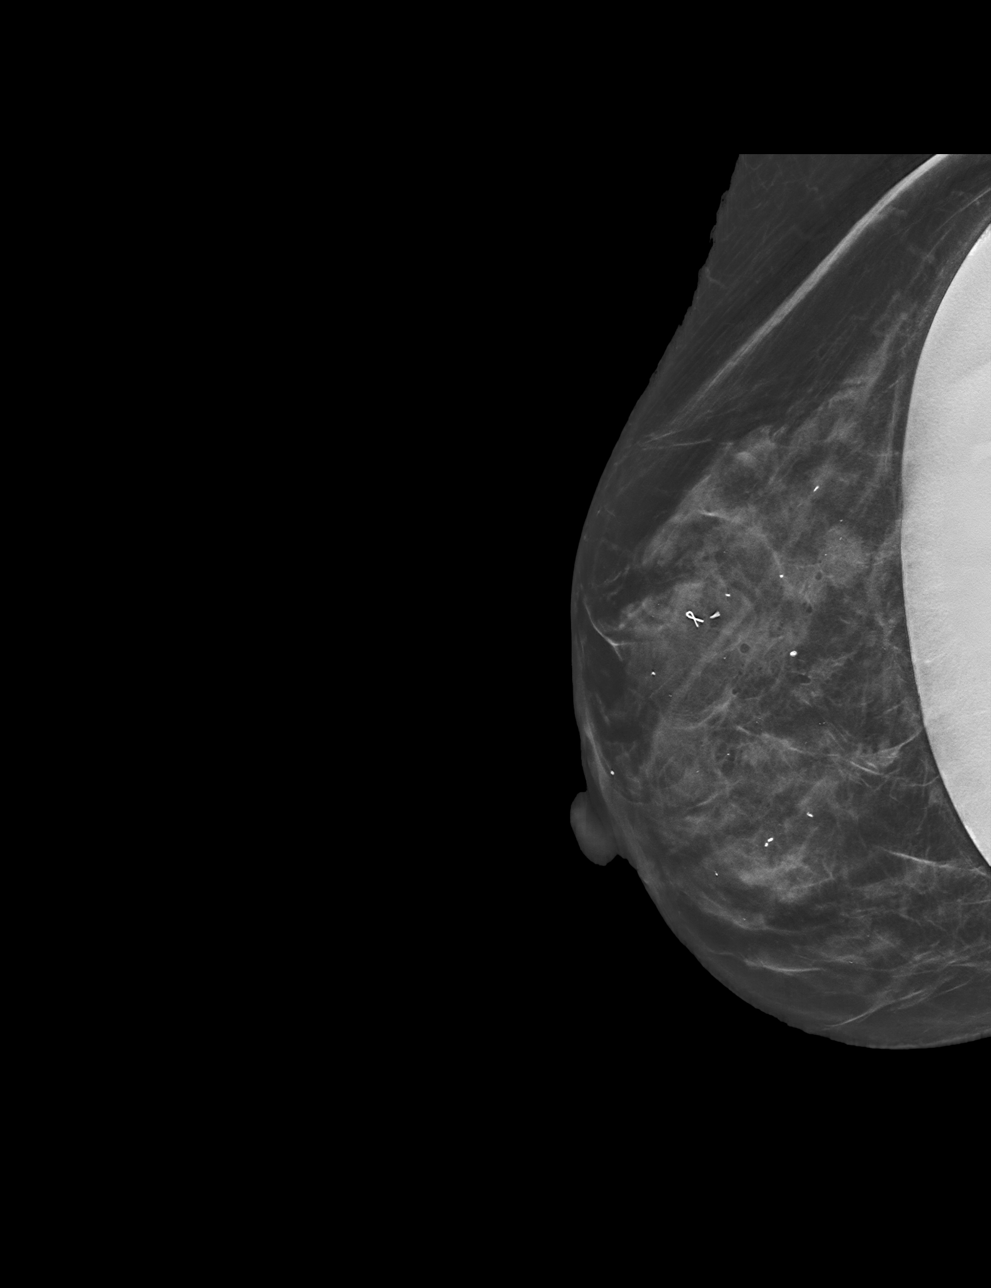

[R CC synth-2D]
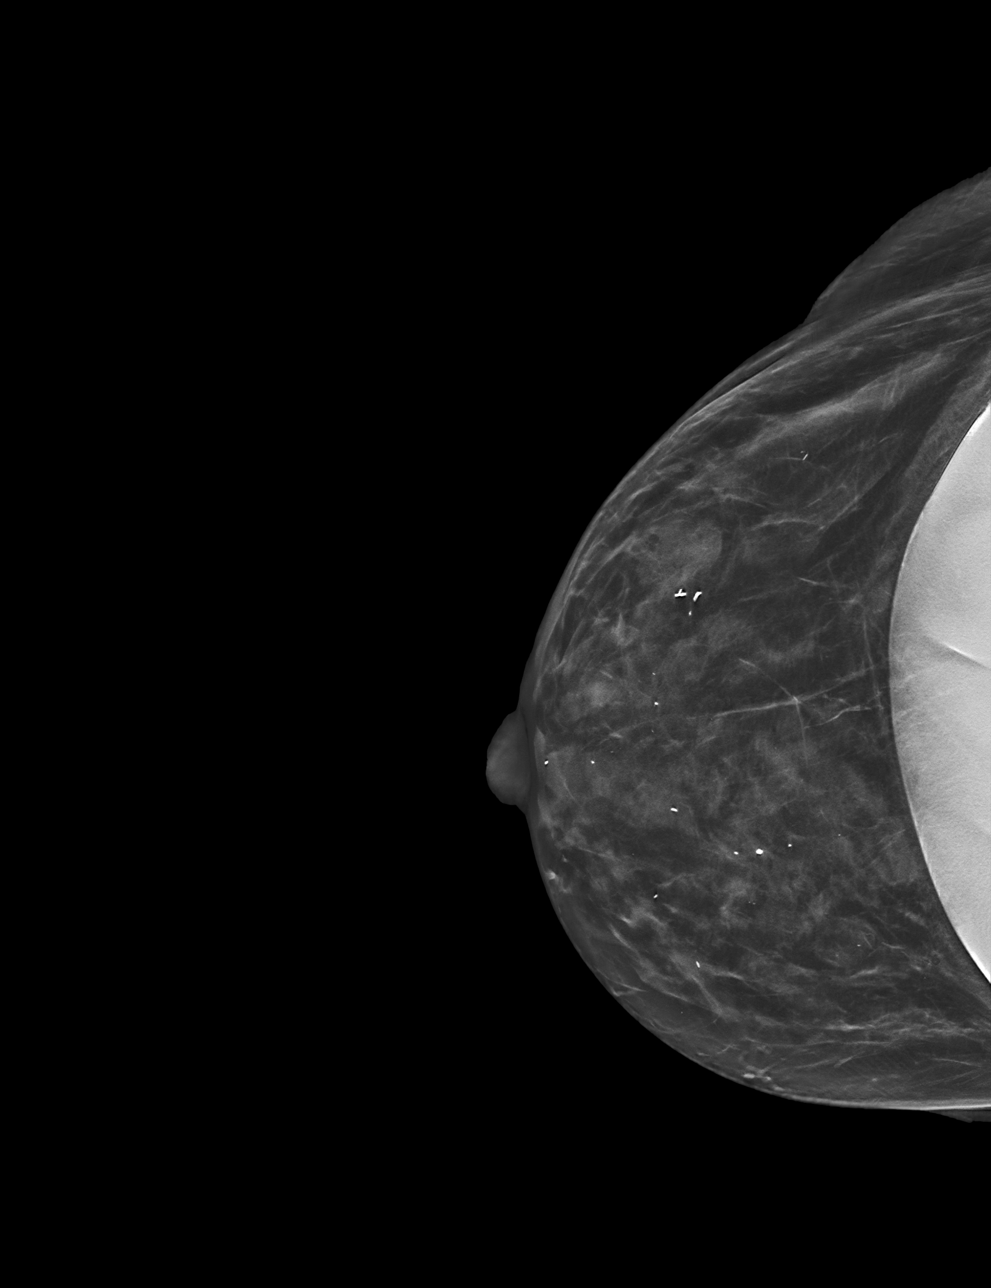

[R CC tomo · tomo slice 25/50.0]
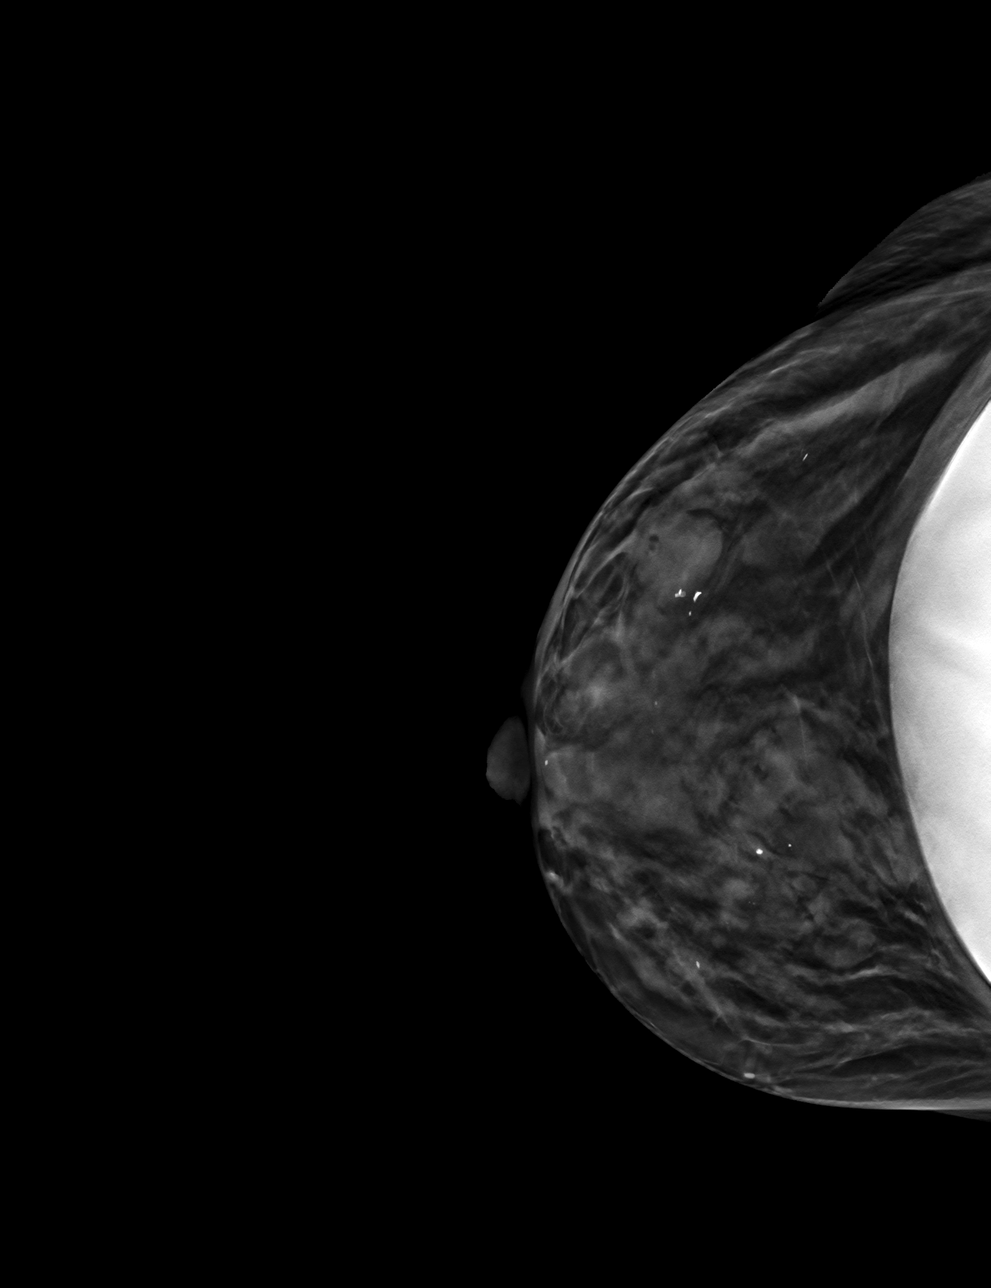

[R ML tomo · tomo slice 26/51.0]
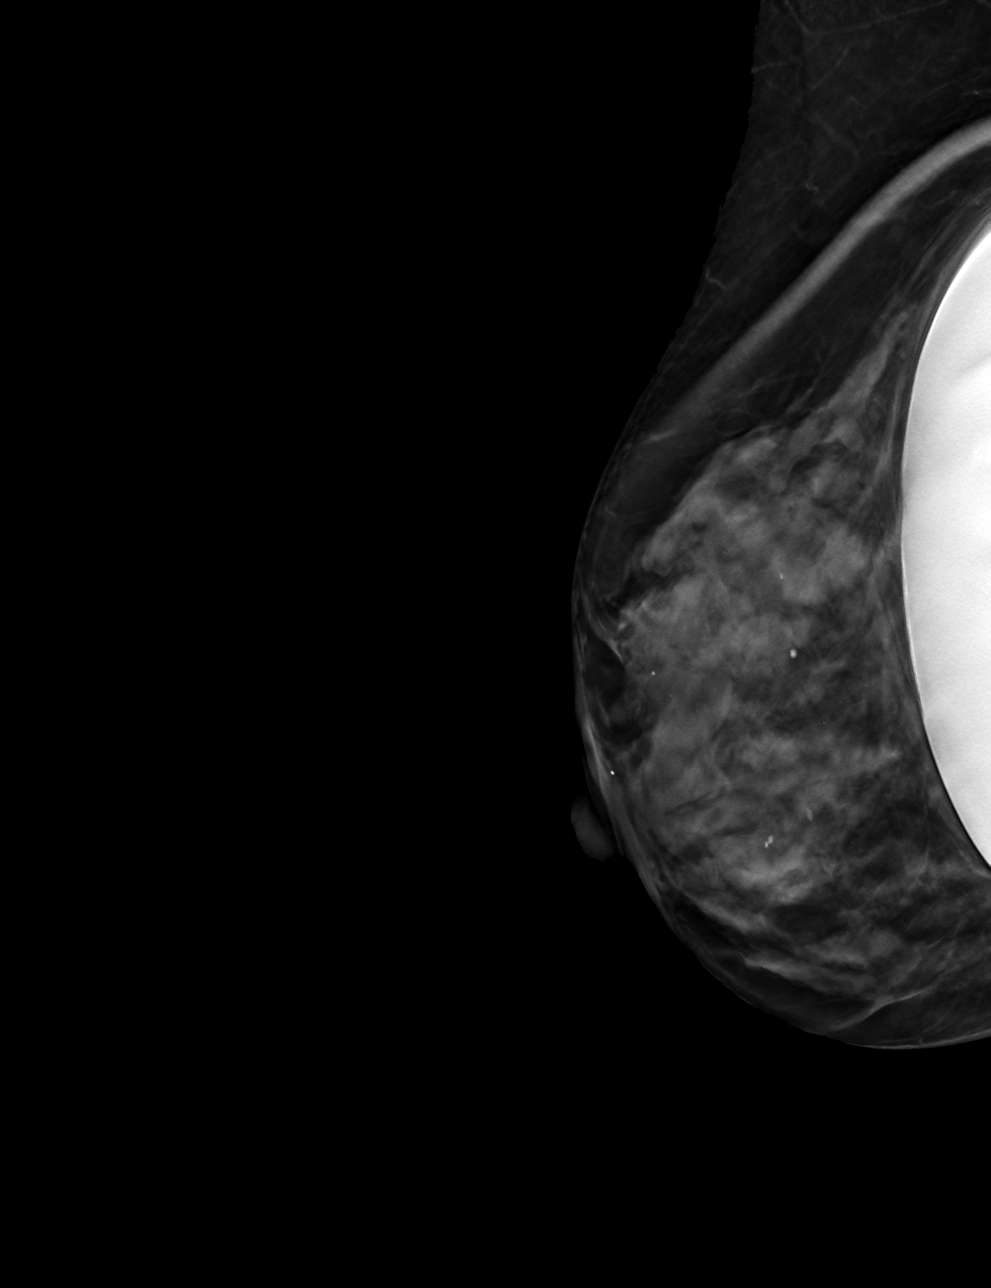

[4 of 12 positions shown; findings below may reference images not displayed]

FINDINGS: 3D Mammographic images were obtained following ultrasound guided
biopsy of intraductal mass in the 10 o'clock location of the RIGHT
breast and placement of ribbon shaped clip. The biopsy marking clip
is in expected position at the site of biopsy.
IMPRESSION: Appropriate positioning of the ribbon shaped biopsy marking clip at
the site of biopsy in the UPPER-OUTER QUADRANT RIGHT breast.

Final Assessment: Post Procedure Mammograms for Marker Placement

## 2023-09-01 DIAGNOSIS — H524 Presbyopia: Secondary | ICD-10-CM | POA: Diagnosis not present

## 2023-09-01 DIAGNOSIS — H2513 Age-related nuclear cataract, bilateral: Secondary | ICD-10-CM | POA: Diagnosis not present

## 2023-09-01 DIAGNOSIS — H04123 Dry eye syndrome of bilateral lacrimal glands: Secondary | ICD-10-CM | POA: Diagnosis not present
